# Patient Record
Sex: Female | Born: 1975 | Race: White | Hispanic: Yes | Marital: Married | Smoking: Never smoker
Health system: Southern US, Community
[De-identification: ages and names within clinical notes are randomized; demographics above are authoritative.]

## PROBLEM LIST (undated history)

## (undated) DIAGNOSIS — Z8619 Personal history of other infectious and parasitic diseases: Secondary | ICD-10-CM

## (undated) DIAGNOSIS — M1990 Unspecified osteoarthritis, unspecified site: Secondary | ICD-10-CM

## (undated) HISTORY — DX: Unspecified osteoarthritis, unspecified site: M19.90

## (undated) HISTORY — DX: Personal history of other infectious and parasitic diseases: Z86.19

---

## 2001-12-03 HISTORY — PX: TUBAL LIGATION: SHX77

## 2005-04-27 ENCOUNTER — Ambulatory Visit: Payer: Self-pay | Admitting: Obstetrics and Gynecology

## 2005-06-01 ENCOUNTER — Ambulatory Visit: Payer: Self-pay

## 2005-09-24 ENCOUNTER — Encounter: Admission: RE | Admit: 2005-09-24 | Discharge: 2005-12-23 | Payer: Self-pay | Admitting: Anesthesiology

## 2005-09-24 ENCOUNTER — Ambulatory Visit: Payer: Self-pay | Admitting: Anesthesiology

## 2005-12-13 ENCOUNTER — Ambulatory Visit: Payer: Self-pay | Admitting: Physical Medicine and Rehabilitation

## 2005-12-13 ENCOUNTER — Encounter
Admission: RE | Admit: 2005-12-13 | Discharge: 2006-03-13 | Payer: Self-pay | Admitting: Physical Medicine and Rehabilitation

## 2010-10-03 ENCOUNTER — Ambulatory Visit: Payer: Self-pay | Admitting: Obstetrics and Gynecology

## 2011-12-04 LAB — HM PAP SMEAR: HM Pap smear: NORMAL

## 2013-09-29 LAB — LIPID PANEL: Cholesterol: 196

## 2013-09-30 ENCOUNTER — Ambulatory Visit: Payer: Self-pay | Admitting: Adult Health

## 2015-05-04 ENCOUNTER — Ambulatory Visit: Payer: Self-pay | Admitting: Family Medicine

## 2015-12-19 ENCOUNTER — Ambulatory Visit (INDEPENDENT_AMBULATORY_CARE_PROVIDER_SITE_OTHER): Payer: Commercial Managed Care - PPO | Admitting: Family Medicine

## 2015-12-19 ENCOUNTER — Encounter: Payer: Self-pay | Admitting: Family Medicine

## 2015-12-19 VITALS — BP 110/60 | HR 64 | Temp 98.6°F | Ht 60.5 in | Wt 113.0 lb

## 2015-12-19 DIAGNOSIS — Z0000 Encounter for general adult medical examination without abnormal findings: Secondary | ICD-10-CM | POA: Diagnosis not present

## 2015-12-19 DIAGNOSIS — Z13220 Encounter for screening for lipoid disorders: Secondary | ICD-10-CM | POA: Diagnosis not present

## 2015-12-19 DIAGNOSIS — Z23 Encounter for immunization: Secondary | ICD-10-CM

## 2015-12-19 DIAGNOSIS — Z131 Encounter for screening for diabetes mellitus: Secondary | ICD-10-CM | POA: Diagnosis not present

## 2015-12-19 DIAGNOSIS — M79644 Pain in right finger(s): Secondary | ICD-10-CM | POA: Diagnosis not present

## 2015-12-19 DIAGNOSIS — Z1239 Encounter for other screening for malignant neoplasm of breast: Secondary | ICD-10-CM | POA: Diagnosis not present

## 2015-12-19 LAB — BASIC METABOLIC PANEL
BUN: 11 mg/dL (ref 6–23)
CHLORIDE: 106 meq/L (ref 96–112)
CO2: 26 mEq/L (ref 19–32)
CREATININE: 0.51 mg/dL (ref 0.40–1.20)
Calcium: 9.4 mg/dL (ref 8.4–10.5)
GFR: 141.26 mL/min (ref >60.00)
GLUCOSE: 84 mg/dL (ref 70–99)
Potassium: 4.3 mEq/L (ref 3.5–5.1)
Sodium: 140 mEq/L (ref 135–145)

## 2015-12-19 LAB — LIPID PANEL
CHOL/HDL RATIO: 3
CHOLESTEROL: 200 mg/dL (ref 0–200)
HDL: 58.9 mg/dL (ref >39.00)
LDL CALC: 129 mg/dL — AB (ref 0–99)
NonHDL: 141.43
TRIGLYCERIDES: 60 mg/dL (ref 0.0–149.0)
VLDL: 12 mg/dL (ref 0.0–40.0)

## 2015-12-19 NOTE — Patient Instructions (Addendum)
Gusto conocerla hoy. La llamaremos para cita para mamograma en [facility]. labwork today. Return as needed or in 1 year for next physical.  Health Maintenance, Female Adopting a healthy lifestyle and getting preventive care can go a long way to promote health and wellness. Talk with your health care provider about what schedule of regular examinations is right for you. This is a good chance for you to check in with your provider about disease prevention and staying healthy. In between checkups, there are plenty of things you can do on your own. Experts have done a lot of research about which lifestyle changes and preventive measures are most likely to keep you healthy. Ask your health care provider for more information. WEIGHT AND DIET  Eat a healthy diet  Be sure to include plenty of vegetables, fruits, low-fat dairy products, and lean protein.  Do not eat a lot of foods high in solid fats, added sugars, or salt.  Get regular exercise. This is one of the most important things you can do for your health.  Most adults should exercise for at least 150 minutes each week. The exercise should increase your heart rate and make you sweat (moderate-intensity exercise).  Most adults should also do strengthening exercises at least twice a week. This is in addition to the moderate-intensity exercise.  Maintain a healthy weight  Body mass index (BMI) is a measurement that can be used to identify possible weight problems. It estimates body fat based on height and weight. Your health care provider can help determine your BMI and help you achieve or maintain a healthy weight.  For females 16 years of age and older:   A BMI below 18.5 is considered underweight.  A BMI of 18.5 to 24.9 is normal.  A BMI of 25 to 29.9 is considered overweight.  A BMI of 30 and above is considered obese.  Watch levels of cholesterol and blood lipids  You should start having your blood tested for lipids  and cholesterol at 41 years of age, then have this test every 5 years.  You may need to have your cholesterol levels checked more often if:  Your lipid or cholesterol levels are high.  You are older than 41 years of age.  You are at high risk for heart disease.  CANCER SCREENING   Lung Cancer  Lung cancer screening is recommended for adults 74-76 years old who are at high risk for lung cancer because of a history of smoking.  A yearly low-dose CT scan of the lungs is recommended for people who:  Currently smoke.  Have quit within the past 15 years.  Have at least a 30-pack-year history of smoking. A pack year is smoking an average of one pack of cigarettes a day for 1 year.  Yearly screening should continue until it has been 15 years since you quit.  Yearly screening should stop if you develop a health problem that would prevent you from having lung cancer treatment.  Breast Cancer  Practice breast self-awareness. This means understanding how your breasts normally appear and feel.  It also means doing regular breast self-exams. Let your health care provider know about any changes, no matter how small.  If you are in your 20s or 30s, you should have a clinical breast exam (CBE) by a health care provider every 1-3 years as part of a regular health exam.  If you are 31 or older, have a CBE every year. Also consider having a breast  X-ray (mammogram) every year.  If you have a family history of breast cancer, talk to your health care provider about genetic screening.  If you are at high risk for breast cancer, talk to your health care provider about having an MRI and a mammogram every year.  Breast cancer gene (BRCA) assessment is recommended for women who have family members with BRCA-related cancers. BRCA-related cancers include:  Breast.  Ovarian.  Tubal.  Peritoneal cancers.  Results of the assessment will determine the need for genetic counseling and BRCA1 and  BRCA2 testing. Cervical Cancer Your health care provider may recommend that you be screened regularly for cancer of the pelvic organs (ovaries, uterus, and vagina). This screening involves a pelvic examination, including checking for microscopic changes to the surface of your cervix (Pap test). You may be encouraged to have this screening done every 3 years, beginning at age 1.  For women ages 55-65, health care providers may recommend pelvic exams and Pap testing every 3 years, or they may recommend the Pap and pelvic exam, combined with testing for human papilloma virus (HPV), every 5 years. Some types of HPV increase your risk of cervical cancer. Testing for HPV may also be done on women of any age with unclear Pap test results.  Other health care providers may not recommend any screening for nonpregnant women who are considered low risk for pelvic cancer and who do not have symptoms. Ask your health care provider if a screening pelvic exam is right for you.  If you have had past treatment for cervical cancer or a condition that could lead to cancer, you need Pap tests and screening for cancer for at least 20 years after your treatment. If Pap tests have been discontinued, your risk factors (such as having a new sexual partner) need to be reassessed to determine if screening should resume. Some women have medical problems that increase the chance of getting cervical cancer. In these cases, your health care provider may recommend more frequent screening and Pap tests. Colorectal Cancer  This type of cancer can be detected and often prevented.  Routine colorectal cancer screening usually begins at 41 years of age and continues through 41 years of age.  Your health care provider may recommend screening at an earlier age if you have risk factors for colon cancer.  Your health care provider may also recommend using home test kits to check for hidden blood in the stool.  A small camera at the end of  a tube can be used to examine your colon directly (sigmoidoscopy or colonoscopy). This is done to check for the earliest forms of colorectal cancer.  Routine screening usually begins at age 58.  Direct examination of the colon should be repeated every 5-10 years through 41 years of age. However, you may need to be screened more often if early forms of precancerous polyps or small growths are found. Skin Cancer  Check your skin from head to toe regularly.  Tell your health care provider about any new moles or changes in moles, especially if there is a change in a mole's shape or color.  Also tell your health care provider if you have a mole that is larger than the size of a pencil eraser.  Always use sunscreen. Apply sunscreen liberally and repeatedly throughout the day.  Protect yourself by wearing long sleeves, pants, a wide-brimmed hat, and sunglasses whenever you are outside. HEART DISEASE, DIABETES, AND HIGH BLOOD PRESSURE   High blood pressure causes heart  disease and increases the risk of stroke. High blood pressure is more likely to develop in:  People who have blood pressure in the high end of the normal range (130-139/85-89 mm Hg).  People who are overweight or obese.  People who are African American.  If you are 71-41 years of age, have your blood pressure checked every 3-5 years. If you are 58 years of age or older, have your blood pressure checked every year. You should have your blood pressure measured twice--once when you are at a hospital or clinic, and once when you are not at a hospital or clinic. Record the average of the two measurements. To check your blood pressure when you are not at a hospital or clinic, you can use:  An automated blood pressure machine at a pharmacy.  A home blood pressure monitor.  If you are between 61 years and 78 years old, ask your health care provider if you should take aspirin to prevent strokes.  Have regular diabetes screenings. This  involves taking a blood sample to check your fasting blood sugar level.  If you are at a normal weight and have a low risk for diabetes, have this test once every three years after 41 years of age.  If you are overweight and have a high risk for diabetes, consider being tested at a younger age or more often. PREVENTING INFECTION  Hepatitis B  If you have a higher risk for hepatitis B, you should be screened for this virus. You are considered at high risk for hepatitis B if:  You were born in a country where hepatitis B is common. Ask your health care provider which countries are considered high risk.  Your parents were born in a high-risk country, and you have not been immunized against hepatitis B (hepatitis B vaccine).  You have HIV or AIDS.  You use needles to inject street drugs.  You live with someone who has hepatitis B.  You have had sex with someone who has hepatitis B.  You get hemodialysis treatment.  You take certain medicines for conditions, including cancer, organ transplantation, and autoimmune conditions. Hepatitis C  Blood testing is recommended for:  Everyone born from [birth year] through 1966.  Anyone with known risk factors for hepatitis C. Sexually transmitted infections (STIs)  You should be screened for sexually transmitted infections (STIs) including gonorrhea and chlamydia if:  You are sexually active and are younger than 41 years of age.  You are older than 41 years of age and your health care provider tells you that you are at risk for this type of infection.  Your sexual activity has changed since you were last screened and you are at an increased risk for chlamydia or gonorrhea. Ask your health care provider if you are at risk.  If you do not have HIV, but are at risk, it may be recommended that you take a prescription medicine daily to prevent HIV infection. This is called pre-exposure prophylaxis (PrEP). You are considered at risk if:  You are  sexually active and do not regularly use condoms or know the HIV status of your partner(s).  You take drugs by injection.  You are sexually active with a partner who has HIV. Talk with your health care provider about whether you are at high risk of being infected with HIV. If you choose to begin PrEP, you should first be tested for HIV. You should then be tested every 3 months for as long as you are taking  PrEP.  PREGNANCY   If you are premenopausal and you may become pregnant, ask your health care provider about preconception counseling.  If you may become pregnant, take 400 to 800 micrograms (mcg) of folic acid every day.  If you want to prevent pregnancy, talk to your health care provider about birth control (contraception). OSTEOPOROSIS AND MENOPAUSE   Osteoporosis is a disease in which the bones lose minerals and strength with aging. This can result in serious bone fractures. Your risk for osteoporosis can be identified using a bone density scan.  If you are 65 years of age or older, or if you are at risk for osteoporosis and fractures, ask your health care provider if you should be screened.  Ask your health care provider whether you should take a calcium or vitamin D supplement to lower your risk for osteoporosis.  Menopause may have certain physical symptoms and risks.  Hormone replacement therapy may reduce some of these symptoms and risks. Talk to your health care provider about whether hormone replacement therapy is right for you.  HOME CARE INSTRUCTIONS   Schedule regular health, dental, and eye exams.  Stay current with your immunizations.   Do not use any tobacco products including cigarettes, chewing tobacco, or electronic cigarettes.  If you are pregnant, do not drink alcohol.  If you are breastfeeding, limit how much and how often you drink alcohol.  Limit alcohol intake to no more than 1 drink per day for nonpregnant women. One drink equals 12 ounces of beer, 5  ounces of wine, or 1 ounces of hard liquor.  Do not use street drugs.  Do not share needles.  Ask your health care provider for help if you need support or information about quitting drugs.  Tell your health care provider if you often feel depressed.  Tell your health care provider if you have ever been abused or do not feel safe at home.   This information is not intended to replace advice given to you by your health care provider. Make sure you discuss any questions you have with your health care provider.   Document Released: 06/04/2011 Document Revised: 12/10/2014 Document Reviewed: 10/21/2013 Elsevier Interactive Patient Education [employer].

## 2015-12-19 NOTE — Addendum Note (Signed)
Addended by: Royann Shivers A on: 12/19/2015 10:54 AM   Modules accepted: Orders

## 2015-12-19 NOTE — Assessment & Plan Note (Addendum)
Preventative protocols reviewed and updated unless pt declined. Discussed healthy diet and lifestyle.  Well woman exam will be due next year.

## 2015-12-19 NOTE — Assessment & Plan Note (Signed)
At [facility]. Has seen rheum, s/p MRI. Will await records she will bring for me to review. Advised return when in acute flare for further eval.

## 2015-12-19 NOTE — Progress Notes (Signed)
Pre visit review using our clinic review tool, if applicable. No additional management support is needed unless otherwise documented below in the visit note. 

## 2015-12-19 NOTE — Progress Notes (Signed)
BP 110/60 mmHg  Pulse 64  Temp(Src) 98.6 F (37 C) (Oral)  Ht 5' 0.5" (1.537 m)  Wt 113 lb (51.256 kg)  BMI 21.70 kg/m2  LMP 11/14/2015   CC: new pt to establish, requests CPE Subjective:    Patient ID: Olivia Estrada, female    DOB: 09-May-1976, 40 y.o.   MRN: [id number]  HPI: Olivia Estrada is a 40 y.o. female presenting on 12/19/2015 for Establish Care   Arthritis - started in R thumb at [facility]. Rest helps. Doesn't try medicine for this. About once a year has flare where thumb joint gets red, swollen very tender. Prednisone helps. Has seen hand specialist with [facility] who told her she had beginnings of arthritis. 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 Also notices knee pain intermittently.   Preventative: Last CPE 1 yr ago Well woman - 2 yrs ago with Dr Patty Sermons at [facility]. Normal pap smear. All normal in the past. Pap smear will be due next year.  Mammogram -  First mammogram normal at age 12yo. Interested in rpt today. Does breast exams at home.  Flu - declines Tdap - today Seat belt use discussed Sunscreen use discussed. No changing moles on skin.  LMP 11/14/2015. 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 Interim medical history since our last visit reviewed. Allergies and medications reviewed and updated. No current outpatient prescriptions on file prior to visit.   No current facility-administered medications on file prior to visit.    Review of Systems  Constitutional: Negative for fever, chills, activity change, appetite change, fatigue and unexpected weight change.  HENT: Negative for hearing loss.   Eyes: Negative for visual disturbance.  Respiratory: Negative for cough, chest  tightness, shortness of breath and wheezing.   Cardiovascular: Negative for chest pain, palpitations and leg swelling.  Gastrointestinal: Positive for constipation. Negative for nausea, vomiting, abdominal pain, diarrhea, blood in stool and abdominal distention.  Genitourinary: Negative for hematuria and difficulty urinating.  Musculoskeletal: Negative for myalgias, arthralgias and neck pain.  Skin: Negative for rash.  Neurological: Negative for dizziness, seizures, syncope and headaches.  Hematological: Negative for adenopathy. Does not bruise/bleed easily.  Psychiatric/Behavioral: Negative for dysphoric mood. The patient is not nervous/anxious.    Per HPI unless specifically indicated in ROS section     Objective:    BP 110/60 mmHg  Pulse 64  Temp(Src) 98.6 F (37 C) (Oral)  Ht 5' 0.5" (1.537 m)  Wt 113 lb (51.256 kg)  BMI 21.70 kg/m2  LMP 11/14/2015  Wt Readings from Last 3 Encounters:  12/19/15 113 lb (51.256 kg)    Physical Exam  Constitutional: She is oriented to person, place, and time. She appears well-developed and well-nourished. No distress.  HENT:  Head: Normocephalic and atraumatic.  Right Ear: Hearing, tympanic membrane, external ear and ear canal normal.  Left Ear: Hearing, tympanic membrane, external ear and ear canal normal.  Nose: Nose normal.  Mouth/Throat: Uvula is midline, oropharynx is clear and moist and mucous membranes are normal. No oropharyngeal exudate, posterior oropharyngeal edema or posterior oropharyngeal erythema.  Eyes: Conjunctivae and EOM are normal. Pupils are equal, round, and reactive to light. No scleral icterus.  Neck: Normal range of motion. Neck supple. No thyromegaly  present.  Cardiovascular: Normal rate, regular rhythm, normal heart sounds and intact distal pulses.   No murmur heard. Pulses:      Radial pulses are 2+ on the right side, and 2+ on the left side.  Pulmonary/Chest: Effort normal and breath sounds normal. No respiratory  distress. She has no wheezes. She has no rales.  Abdominal: Soft. Bowel sounds are normal. She exhibits no distension and no mass. There is no tenderness. There is no rebound and no guarding.  Musculoskeletal: Normal range of motion. She exhibits no edema.  Lymphadenopathy:    She has no cervical adenopathy.  Neurological: She is alert and oriented to person, place, and time.  CN grossly intact, station and gait intact  Skin: Skin is warm and dry. No rash noted.  Psychiatric: She has a normal mood and affect. Her behavior is normal. Judgment and thought content normal.  Nursing note and vitals reviewed.  No results found for this or any previous visit.    Assessment & Plan:   Problem List Items Addressed This Visit    Pain of right thumb    At [facility]. Has seen rheum, s/p MRI. Will await records she will bring for me to review. Advised return when in acute flare for further eval.      Health maintenance examination - Primary    Preventative protocols reviewed and updated unless pt declined. Discussed healthy diet and lifestyle.  Well woman exam will be due next year.       Other Visit Diagnoses    Breast cancer screening        Relevant Orders    MM Digital Screening    Lipid screening        Relevant Orders    Lipid panel    Diabetes mellitus screening        Relevant Orders    Basic metabolic panel        Follow up plan: Return in about 1 year (around 12/18/2016), or as needed, for annual exam, prior fasting for blood work.

## 2015-12-20 ENCOUNTER — Telehealth: Payer: Self-pay | Admitting: Family Medicine

## 2015-12-20 ENCOUNTER — Encounter: Payer: Self-pay | Admitting: *Deleted

## 2015-12-20 NOTE — Telephone Encounter (Signed)
Noted. Will cancel referral, rediscuss at next CPE.

## 2015-12-20 NOTE — Telephone Encounter (Signed)
Patient was seen yesterday.  Patient said she has decided she doesn't want to be referred for a  mammogram.

## 2015-12-24 ENCOUNTER — Encounter: Payer: Self-pay | Admitting: Family Medicine

## 2015-12-28 ENCOUNTER — Encounter: Payer: Self-pay | Admitting: *Deleted

## 2016-07-17 ENCOUNTER — Encounter: Payer: Self-pay | Admitting: Family Medicine

## 2016-07-17 ENCOUNTER — Ambulatory Visit (INDEPENDENT_AMBULATORY_CARE_PROVIDER_SITE_OTHER): Payer: Commercial Managed Care - PPO | Admitting: Family Medicine

## 2016-07-17 VITALS — BP 106/68 | Temp 97.9°F | Wt 105.8 lb

## 2016-07-17 DIAGNOSIS — M79644 Pain in right finger(s): Secondary | ICD-10-CM | POA: Diagnosis not present

## 2016-07-17 LAB — URIC ACID: Uric Acid, Serum: 2.5 mg/dL (ref 2.4–7.0)

## 2016-07-17 MED ORDER — PREDNISONE 20 MG PO TABS: ORAL_TABLET | ORAL | 0 refills | Status: DC

## 2016-07-17 NOTE — Progress Notes (Signed)
   Subjective:    Patient ID: Olivia Estrada, female    DOB: Apr 25, 1976, 40 y.o.   MRN: [id number]  HPI  40 year old female pt of Dr. Darnell Level (new to practice in 12/2105) presents for  chronic right thumb pain with acute worsening of pain in last 24 hours. She had sudden onset pain 4 Am, she feels may have flared when she was sleeping on it wrong. She has noted increase in swelling , no redness, no warmth in joint.  She has not taken anything for pain.   No new fall or injury.  She  Has had [facility] pain on right for years. Once a year she has a flare and in past prednisone has helped. Has seen hand specialist with [facility] who told her she had beginnings of arthritis.  MRI thumb per pt 2012 showed arthritis.  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  She is not sure she was tested for gout.   She would like referral to different hand specialist.  Body mass index is 20.31 kg/m.  Review of Systems  Constitutional: Negative for fatigue.  HENT: Negative for ear pain.   Eyes: Negative for pain.  Respiratory: Negative for cough and shortness of breath.        Objective:   Physical Exam  Constitutional: Vital signs are normal. She appears well-developed and well-nourished. She is cooperative.  Non-toxic appearance. She does not appear ill. No distress.  HENT:  Head: Normocephalic.  Right Ear: Hearing, tympanic membrane, external ear and ear canal normal. Tympanic membrane is not erythematous, not retracted and not bulging.  Left Ear: Hearing, tympanic membrane, external ear and ear canal normal. Tympanic membrane is not erythematous, not retracted and not bulging.  Nose: No mucosal edema or rhinorrhea. Right sinus exhibits no maxillary sinus tenderness and no frontal sinus tenderness. Left sinus exhibits no maxillary sinus tenderness and no frontal sinus tenderness.  Mouth/Throat: Uvula is midline, oropharynx is clear and moist and mucous membranes are normal.  Eyes:  Conjunctivae, EOM and lids are normal. Pupils are equal, round, and reactive to light. Lids are everted and swept, no foreign bodies found.  Neck: Trachea normal and normal range of motion. Neck supple. Carotid bruit is not present. No thyroid mass and no thyromegaly present.  Cardiovascular: Normal rate, regular rhythm, S1 normal, S2 normal, normal heart sounds, intact distal pulses and normal pulses.  Exam reveals no gallop and no friction rub.   No murmur heard. Pulmonary/Chest: Effort normal and breath sounds normal. No tachypnea. No respiratory distress. She has no decreased breath sounds. She has no wheezes. She has no rhonchi. She has no rales.  Abdominal: Soft. Normal appearance and bowel sounds are normal. There is no tenderness.  Musculoskeletal:  Right thumb MCP joint with swelling, no redness  decreased ROM, very tender to touch in right MCP  Neurological: She is alert.  Skin: Skin is warm, dry and intact. No rash noted.  Psychiatric: Her speech is normal and behavior is normal. Judgment and thought content normal. Her mood appears not anxious. Cognition and memory are normal. She does not exhibit a depressed mood.          Assessment & Plan:

## 2016-07-17 NOTE — Patient Instructions (Addendum)
Complete a course of prednisone.  Stop at lab on way out. Call if pain is not improving. Stop at front desk to set up referral.

## 2016-07-17 NOTE — Assessment & Plan Note (Signed)
Likely [facility] joint arthritis. Will check uric acid flor gout given location. Severe pain with flare.  Treat with course of prednisone.. Refer to hand specialist as pt would like definitive treatment for OA recommendations such as surgery if possible.

## 2016-12-11 ENCOUNTER — Other Ambulatory Visit: Payer: Self-pay | Admitting: Family Medicine

## 2016-12-11 ENCOUNTER — Encounter: Payer: Self-pay | Admitting: Family Medicine

## 2016-12-11 DIAGNOSIS — Z131 Encounter for screening for diabetes mellitus: Secondary | ICD-10-CM

## 2016-12-12 ENCOUNTER — Other Ambulatory Visit: Payer: Commercial Managed Care - PPO

## 2016-12-19 ENCOUNTER — Encounter: Payer: Commercial Managed Care - PPO | Admitting: Family Medicine

## 2016-12-25 ENCOUNTER — Other Ambulatory Visit (INDEPENDENT_AMBULATORY_CARE_PROVIDER_SITE_OTHER): Payer: Commercial Managed Care - PPO

## 2016-12-25 DIAGNOSIS — Z131 Encounter for screening for diabetes mellitus: Secondary | ICD-10-CM | POA: Diagnosis not present

## 2016-12-25 LAB — BASIC METABOLIC PANEL
BUN: 10 mg/dL (ref 6–23)
CO2: 27 meq/L (ref 19–32)
Calcium: 9.3 mg/dL (ref 8.4–10.5)
Chloride: 106 mEq/L (ref 96–112)
Creatinine, Ser: 0.53 mg/dL (ref 0.40–1.20)
GFR: 134.45 mL/min (ref >60.00)
Glucose, Bld: 96 mg/dL (ref 70–99)
Potassium: 4 mEq/L (ref 3.5–5.1)
SODIUM: 138 meq/L (ref 135–145)

## 2016-12-31 ENCOUNTER — Encounter: Payer: Commercial Managed Care - PPO | Admitting: Family Medicine

## 2017-01-01 ENCOUNTER — Ambulatory Visit (INDEPENDENT_AMBULATORY_CARE_PROVIDER_SITE_OTHER): Payer: Commercial Managed Care - PPO | Admitting: Family Medicine

## 2017-01-01 ENCOUNTER — Other Ambulatory Visit (HOSPITAL_COMMUNITY)
Admission: RE | Admit: 2017-01-01 | Discharge: 2017-01-01 | Disposition: A | Discharge status: Home / Self Care | Payer: Commercial Managed Care - PPO | Source: Ambulatory Visit | Attending: Family Medicine | Admitting: Family Medicine

## 2017-01-01 ENCOUNTER — Encounter: Payer: Self-pay | Admitting: Family Medicine

## 2017-01-01 VITALS — BP 108/60 | HR 68 | Temp 98.2°F | Ht 60.5 in | Wt 111.0 lb

## 2017-01-01 DIAGNOSIS — Z1151 Encounter for screening for human papillomavirus (HPV): Secondary | ICD-10-CM | POA: Insufficient documentation

## 2017-01-01 DIAGNOSIS — Z124 Encounter for screening for malignant neoplasm of cervix: Secondary | ICD-10-CM | POA: Insufficient documentation

## 2017-01-01 DIAGNOSIS — R87810 Cervical high risk human papillomavirus (HPV) DNA test positive: Secondary | ICD-10-CM | POA: Diagnosis present

## 2017-01-01 DIAGNOSIS — M79644 Pain in right finger(s): Secondary | ICD-10-CM | POA: Diagnosis not present

## 2017-01-01 DIAGNOSIS — Z0000 Encounter for general adult medical examination without abnormal findings: Secondary | ICD-10-CM | POA: Diagnosis not present

## 2017-01-01 DIAGNOSIS — Z01419 Encounter for gynecological examination (general) (routine) without abnormal findings: Secondary | ICD-10-CM | POA: Diagnosis present

## 2017-01-01 DIAGNOSIS — N907 Vulvar cyst: Secondary | ICD-10-CM | POA: Insufficient documentation

## 2017-01-01 NOTE — Progress Notes (Signed)
Pre visit review using our clinic review tool, if applicable. No additional management support is needed unless otherwise documented below in the visit note. 

## 2017-01-01 NOTE — Progress Notes (Signed)
BP 108/60   Pulse 68   Temp 98.2 F (36.8 C) (Oral)   Ht 5' 0.5" (1.537 m)   Wt 111 lb (50.3 kg)   LMP 12/27/2016   BMI 21.32 kg/m    CC: CPE Subjective:    Patient ID: Olivia Estrada, female    DOB: [birth year], 41 y.o.   MRN: [id number]  HPI: CHACE FLEER is a 41 y.o. female presenting on 01/01/2017 for Annual Exam   Finger pain - only affecting R thumb at MCP. See prior notes for details. About once a year has flare where thumb joint gets red, swollen very tender. Only prednisone is helpful, needs about once a year. Has seen hand specialist with MRI who told her she had beginnings of arthritis. Last urate was normal (2.5). Brings MRI from 2012 showing likely ganglion cyst at wrist. Latest flare was 07/2016.   Preventative: Well woman - 3 yrs ago with Dr Patty Sermons PCP at [facility]. Normal pap smear. All normal in the past. Pap smear due this year.  Mammogram -  Mammogram normal at age 53yo. Does breast exams at home.  Flu - declines Tdap - 2017 Seat belt use discussed Sunscreen use discussed. No changing moles on skin.  LMP 12/27/2016. Regular periods.  Non smoker Alcohol - socially  Lives with husband and son, 1 dog and parrot Occupation: Materials engineer and house cleaning, works 4d/wk at [employer]: walks 1 hour every day Diet: 8 glasses of water daily, fruits/vegteables daily  Relevant past medical, surgical, family and social history reviewed and updated as indicated. Interim medical history since our last visit reviewed. Allergies and medications reviewed and updated. Current Outpatient Prescriptions on File Prior to Visit  Medication Sig  . Multiple Vitamin (MULTIVITAMIN) tablet Take 1 tablet by mouth daily.   No current facility-administered medications on file prior to visit.     Review of Systems  Constitutional: Negative for activity change, appetite change, chills, fatigue, fever and unexpected weight change.  HENT: Negative for hearing loss.     Eyes: Negative for visual disturbance.  Respiratory: Negative for cough, chest tightness, shortness of breath and wheezing.   Cardiovascular: Negative for chest pain, palpitations and leg swelling.  Gastrointestinal: Positive for blood in stool (occasionally with hemorrhoids). Negative for abdominal distention, abdominal pain, constipation, diarrhea, nausea and vomiting.  Genitourinary: Negative for difficulty urinating and hematuria.  Musculoskeletal: Negative for arthralgias, myalgias and neck pain.  Skin: Negative for rash.  Neurological: Negative for dizziness, seizures, syncope and headaches.  Hematological: Negative for adenopathy. Does not bruise/bleed easily.  Psychiatric/Behavioral: Negative for dysphoric mood. The patient is not nervous/anxious.    Per HPI unless specifically indicated in ROS section     Objective:    BP 108/60   Pulse 68   Temp 98.2 F (36.8 C) (Oral)   Ht 5' 0.5" (1.537 m)   Wt 111 lb (50.3 kg)   LMP 12/27/2016   BMI 21.32 kg/m   Wt Readings from Last 3 Encounters:  01/01/17 111 lb (50.3 kg)  07/17/16 105 lb 12 oz (48 kg)  12/19/15 113 lb (51.3 kg)    Physical Exam  Constitutional: She is oriented to person, place, and time. She appears well-developed and well-nourished. No distress.  HENT:  Head: Normocephalic and atraumatic.  Right Ear: Hearing, tympanic membrane, external ear and ear canal normal.  Left Ear: Hearing, tympanic membrane, external ear and ear canal normal.  Nose: Nose normal.  Mouth/Throat: Uvula is midline,  oropharynx is clear and moist and mucous membranes are normal. No oropharyngeal exudate, posterior oropharyngeal edema or posterior oropharyngeal erythema.  Eyes: Conjunctivae and EOM are normal. Pupils are equal, round, and reactive to light. No scleral icterus.  Neck: Normal range of motion. Neck supple. No thyromegaly present.  Cardiovascular: Normal rate, regular rhythm, normal heart sounds and intact distal pulses.   No  murmur heard. Pulses:      Radial pulses are 2+ on the right side, and 2+ on the left side.  Pulmonary/Chest: Effort normal and breath sounds normal. No respiratory distress. She has no wheezes. She has no rales. Right breast exhibits no inverted nipple, no mass, no nipple discharge, no skin change and no tenderness. Left breast exhibits no inverted nipple, no mass, no nipple discharge, no skin change and no tenderness.  Abdominal: Soft. Bowel sounds are normal. She exhibits no distension and no mass. There is no tenderness. There is no rebound and no guarding.  Genitourinary: Vagina normal and uterus normal. Pelvic exam was performed with patient supine. There is lesion (small cyst) on the right labia. There is no rash or tenderness on the right labia. There is no rash, tenderness or lesion on the left labia. Cervix exhibits no motion tenderness and no friability. Right adnexum displays no mass and no tenderness. Left adnexum displays no mass and no tenderness.  Genitourinary Comments: Pap performed  Musculoskeletal: Normal range of motion. She exhibits no edema.  Lymphadenopathy:       Head (right side): No submental, no submandibular, no tonsillar, no preauricular and no posterior auricular adenopathy present.       Head (left side): No submental, no submandibular, no tonsillar, no preauricular and no posterior auricular adenopathy present.    She has no cervical adenopathy.    She has no axillary adenopathy.       Right axillary: No lateral adenopathy present.       Left axillary: No lateral adenopathy present.      Right: No supraclavicular adenopathy present.       Left: No supraclavicular adenopathy present.  Neurological: She is alert and oriented to person, place, and time.  CN grossly intact, station and gait intact  Skin: Skin is warm and dry. No rash noted.  Psychiatric: She has a normal mood and affect. Her behavior is normal. Judgment and thought content normal.  Nursing note and  vitals reviewed.  Results for orders placed or performed in visit on [id number]  Basic metabolic panel  Result Value Ref Range   Sodium 138 135 - 145 mEq/L   Potassium 4.0 3.5 - 5.1 mEq/L   Chloride 106 96 - 112 mEq/L   CO2 27 19 - 32 mEq/L   Glucose, Bld 96 70 - 99 mg/dL   BUN 10 6 - 23 mg/dL   Creatinine, Ser 0.53 0.40 - 1.20 mg/dL   Calcium 9.3 8.4 - 10.5 mg/dL   GFR 134.45 >60.00 mL/min      Assessment & Plan:   Problem List Items Addressed This Visit    Health maintenance examination - Primary    Preventative protocols reviewed and updated unless pt declined. Discussed healthy diet and lifestyle.       Pain of right thumb    1st MCP pain that flares once yearly, only improved with prednisone course.  Urate not consistent with gout. Will refer to hand surgery for further eval/treatment options ?surgical intervention.       Relevant Orders   Ambulatory referral to  Hand Surgery   Vulvar cyst    Discussed with patient - will monitor for now.           Follow up plan: Return in about 1 year (around 01/01/2018) for annual exam, prior fasting for blood work.  Ria Bush, MD

## 2017-01-01 NOTE — Assessment & Plan Note (Signed)
Preventative protocols reviewed and updated unless pt declined. Discussed healthy diet and lifestyle.  

## 2017-01-01 NOTE — Assessment & Plan Note (Signed)
1st MCP pain that flares once yearly, only improved with prednisone course.  Urate not consistent with gout. Will refer to hand surgery for further eval/treatment options ?surgical intervention.

## 2017-01-01 NOTE — Patient Instructions (Addendum)
La mandaremos a otro especialista de mano para evaluar artritis del dedo.  Gusto verla hoy, llamenos con preguntas.  Papanicolau hoy.  Regresar en 1 ao para proximo examen fisico  Mantenimiento de la salud - Mujeres (Health Maintenance, Female) Un estilo de vida saludable y los cuidados preventivos pueden favorecer considerablemente a la salud y Musician. Pregunte a su mdico cul es el cronograma de exmenes peridicos apropiado para usted. Esta es una buena oportunidad para consultarlo sobre cmo prevenir enfermedades y [place] sano. Adems de los controles, hay muchas otras cosas que puede hacer usted mismo. Los expertos han realizado numerosas investigaciones [employer]vin[employer]itor cambios en el estilo de vida y las medidas de prevencin que, [place], lo ayudarn a mantenerse sano. Solicite a su mdico ms informacin. EL PESO Y LA DIETA Consuma una dieta saludable.   Asegrese de [employer] Stores verduras, frutas, productos lcteos de bajo contenido de [place] y Advertising account planner.  No consuma muchos alimentos de alto contenido de grasas slidas, azcares agregados o sal.  Realice actividad fsica con regularidad. Esta es una de las prcticas ms importantes que puede hacer por su salud.  La mayora de los adultos deben hacer ejercicio durante al menos 136mnutos por semana. El ejercicio debe aumentar la frecuencia cardaca y pActorla transpiracin (ejercicio de iPaul.  La mayora de los adultos tambin deben hField seismologistejercicios de elongacin al mToysRusveces a la semana. Agregue esto al su plan de ejercicio de intensidad moderada. Mantenga un peso saludable.   El ndice de masa corporal ([facility] es una medida que puede utilizarse para identificar posibles problemas de p[place] Proporciona una estimacin de la grasa corporal basndose en el peso y la altura. Su mdico puede ayudarle a dRadiation protection practitionerISt. Stepheny a lScientist, forensico mTheatre managerun peso saludable.  Para las mujeres de 20aos o  ms:  Un IRecovery Innovations, Inc.menor de 18,5 se considera bajo peso.  Un IMcleod Medical Center-Darlingtonentre 18,5 y 24,9 es normal.  Un ICares Surgicenter LLCentre 25 y 29,9 se considera sobrepeso.  Un IMC de 30 o ms se considera obesidad. Observe los niveles de colesterol y lpidos en la sangre.   Debe comenzar a rEnglish as a second language teacherde lpidos y cResearch officer, trade unionen la sangre a los 20aos y luego repetirlos cada 599aos  Es posible que nAutomotive engineerlos niveles de colesterol con mayor frecuencia si:  Sus niveles de lpidos y colesterol son altos.  Es mayor de 50aos.  Presenta un alto riesgo de padecer enfermedades cardacas. DETECCIN DE CNCER Cncer de pulmn   Se recomienda realizar exmenes de deteccin de cncer de pulmn a personas adultas entre 540y 843aos que estn en riesgo de dHorticulturist, commercialde pulmn por sus antecedentes de consumo de tabaco.  Se recomienda una tomografa computarizada de baja dosis de los pLiberty Mediaaos a las personas que:  Fuman actualmente.  Hayan dejado el hbito en algn momento en los ltimos 15aos.  Hayan fumado durante 30aos un paquete diario. Un paquete-ao equivale a fumar un promedio de un paquete de cigarrillos diario durante un ao.  Los exmenes de deteccin anuales deben continuar hasta que hayan pasado 15aos desde que dej de fumar.  Ya no debern realizarse si tiene un problema de salud que le impida recibir tratamiento para eScience writerde pulmn. Cncer de mama   Practique la autoconciencia de la mama. Esto significa reconocer la apariencia normal de sus mamas y cmo las siente.  Tambin significa realizar autoexmenes regulares de lJohnson & Johnson Informe a su  mdico sobre cualquier cambio, sin importar cun pequeo sea.  Si tiene entre 20 y 14 aos, un mdico debe realizarle un examen clnico de las mamas como parte del examen regular de [place], cada 1 a 3aos.  Si tiene 40aos o ms, debe Information systems manager clnico de las Microsoft. Tambin considere  realizarse una [place]) todos los [place].  Si tiene antecedentes familiares de cncer de mama, hable con su mdico para someterse a un estudio gentico.  Si tiene alto riesgo de Chief Financial Officer de mama, hable con su mdico para someterse a Public house manager y [employer].  La evaluacin del gen del cncer de mama (BRCA) se recomienda a mujeres que tengan familiares con cnceres relacionados con el BRCA. Los cnceres relacionados con el BRCA incluyen los siguientes:  Mama.  Ovario.  Trompas.  Cnceres de peritoneo.  Los resultados de la evaluacin determinarn la necesidad de asesoramiento gentico y de [place] de BRCA1 y BRCA2. Cncer de cuello del tero  El mdico puede recomendarle que se haga pruebas peridicas de deteccin de cncer de los rganos de la pelvis (ovarios, tero y vagina). Estas pruebas incluyen un examen plvico, que abarca controlar si se produjeron cambios microscpicos en la superficie del cuello del tero (prueba de Papanicolaou). Pueden recomendarle que se haga estas pruebas cada 3aos, a partir de los 21aos.  A las mujeres que tienen entre 30 y 52aos, los mdicos pueden recomendarles que se sometan a exmenes plvicos y pruebas de Papanicolaou cada 38aos, o a la prueba de Papanicolaou y el examen plvico en combinacin con estudios de deteccin del virus del papiloma humano (VPH) cada 5aos. Algunos tipos de VPH aumentan el riesgo de Chief Financial Officer de cuello del tero. La prueba para la deteccin del VPH tambin puede realizarse a mujeres de cualquier edad cuyos resultados de la prueba de Papanicolaou no sean claros.  Es posible que otros mdicos no recomienden exmenes de deteccin a mujeres no embarazadas que se consideran sujetos de bajo riesgo de Chief Financial Officer de pelvis y que no tienen sntomas. Pregntele al mdico si un examen plvico de deteccin es adecuado para usted.  Si ha recibido un tratamiento  para Science writer cervical o una enfermedad que podra causar cncer, necesitar realizarse una prueba de Papanicolaou y controles durante al menos 45 aos de concluido el [place]. Si no se ha hecho el Papanicolaou con regularidad, debern volver a evaluarse los factores de riesgo (como tener un nuevo compaero sexual), para Teacher, adult education si debe realizarse los estudios nuevamente. Algunas mujeres sufren problemas mdicos que aumentan la probabilidad de Museum/gallery curator cncer de cuello del tero. En estos casos, el mdico podr QUALCOMM se realicen controles y pruebas de Papanicolaou con ms frecuencia. Cncer colorrectal   Este tipo de cncer puede detectarse y a menudo prevenirse.  Por lo general, los estudios de rutina se deben Medical laboratory scientific officer a Field seismologist a Proofreader de los 41 aos y [place] 31 aos.  Sin embargo, el mdico podr aconsejarle que lo haga antes, si tiene factores de riesgo para el cncer de colon.  Tambin puede recomendarle que use un kit de prueba para Hydrologist en la materia fecal.  Es posible que se use una pequea cmara en el extremo de un tubo para examinar directamente el colon (sigmoidoscopia o colonoscopia) a fin de Hydrographic surveyor formas tempranas de cncer colorrectal.  Los exmenes de rutina generalmente comienzan a los 36aos.  El examen directo del colon  se debe repetir cada 5 a 10aos hasta los 75aos. Sin embargo, es posible que se realicen exmenes con mayor frecuencia, si se detectan formas tempranas de plipos precancerosos o pequeos bultos. Cncer de piel   Revise la piel de la cabeza a los pies con regularidad.  Informe a su mdico si aparecen nuevos lunares o los que tiene se modifican, especialmente en su forma y color.  Tambin notifique al mdico si tiene un lunar que es ms grande que el tamao de una goma de lpiz.  Siempre use pantalla solar. Aplique pantalla solar de [place] D[place] y repetida a lo largo del Training and development officer.  Protjase usando mangas y Colgate Palmolive, un sombrero de ala ancha y gafas para el sol, siempre que se encuentre en el exterior. ENFERMEDADES CARDACAS, DIABETES E HIPERTENSIN ARTERIAL  La hipertensin arterial causa enfermedades cardacas y [place] el riesgo de ictus. La hipertensin arterial es ms probable en los siguientes casos:  Las personas que tienen la presin arterial en el extremo del rango normal (100-139/85-89 mm Hg).  Las personas con sobrepeso u obesidad.  Las Retail banker.  Si usted tiene entre 18 y 39 aos, debe medirse la presin arterial cada 3 a 5 aos. Si usted tiene 40 aos o ms, debe medirse la presin arterial Hewlett[employer]ard. Debe medirse la presin arterial dos veces: una vez cuando est en un hospital o una clnica y la otra vez cuando est en otro sitio. Registre el promedio de [employer] Stores. Para controlar su presin arterial cuando no est en un hospital o [place], puede usar lo siguiente:  Ardelia Mems mquina automtica para medir la presin arterial en una farmacia.  Un monitor para medir la presin arterial en el hogar.  Si tiene entre 15 y 53 aos, consulte a su mdico si debe tomar aspirina para prevenir el ictus.  Realcese exmenes de deteccin de la diabetes con regularidad. Esto incluye la toma de [place] de sangre para controlar el nivel de azcar en la sangre durante el [place].  Si tiene un peso normal y un bajo riesgo de padecer diabetes, realcese este anlisis cada tres aos despus de los 45aos.  Si tiene sobrepeso y un alto riesgo de padecer diabetes, considere someterse a este anlisis antes o con mayor frecuencia. PREVENCIN DE INFECCIONES HepatitisB   Si tiene un riesgo ms alto de Museum/gallery curator hepatitis B, debe someterse a un examen de deteccin de este virus. Se considera que tiene un alto riesgo de Museum/gallery curator hepatitis B si:  Naci en un pas donde la hepatitis B es frecuente. Pregntele a su mdico qu pases son considerados de Public affairs consultant.  Sus padres  nacieron en un pas de alto riesgo y usted no recibi una vacuna que lo proteja contra la hepatitis B (vacuna contra la hepatitis B).  [place].  [place] agujas para inyectarse drogas.  Vive con alguien que tiene hepatitis B.  Ha tenido sexo con alguien que tiene hepatitis B.  Recibe tratamiento de hemodilisis.  Toma ciertos medicamentos para el cncer, trasplante de rganos y afecciones autoinmunitarias. Hepatitis C   Se recomienda un anlisis de [place] para:  Todos los que nacieron entre 1945 y [phone].  Todas las personas que tengan un riesgo de haber contrado hepatitis C. Enfermedades de transmisin sexual (ETS).   Debe realizarse pruebas de deteccin de enfermedades de transmisin sexual (ETS), incluidas gonorrea y clamidia si:  Es sexualmente activo y es menor de 24aos.  Es mayor de 24aos, y  el mdico le informa que corre riesgo de tener este tipo de infecciones.  La actividad sexual ha cambiado desde que le hicieron la ltima prueba de deteccin y tiene un riesgo mayor de Best boy clamidia o Radio broadcast assistant. Pregntele al mdico si usted tiene riesgo.  Si no tiene el VIH, pero corre riesgo de infectarse por el virus, se recomienda tomar diariamente un medicamento recetado para evitar la infeccin. Esto se conoce como profilaxis previa a la exposicin. Se considera que est en riesgo si:  Es [place] sexualmente y no [place] preservativos habitualmente o no conoce el estado del VIH de sus Advertising copywriter.  Se inyecta drogas.  Es [place] sexualmente con Ardelia Mems pareja que tiene VIH. Consulte a su mdico para saber si tiene un alto riesgo de infectarse por el VIH. Si opta por comenzar la profilaxis previa a la exposicin, primero debe realizarse anlisis de deteccin del VIH. Luego, le harn anlisis cada 8mses mientras est tomando los medicamentos para la profilaxis previa a la exposicin. [facility] Si es premenopusica y puede quedar eRock Point solicite a su mdico asesoramiento  previo a la concepcin.  Si puede quedar embarazada, tome 400 a [id number](mcg) de cido fAnheuser-Busch  Si desea evitar el embarazo, hable con su mdico sobre el control de la natalidad (anticoncepcin). OSTEOPOROSIS Y MENOPAUSIA  La osteoporosis es una enfermedad en la que los huesos pierden los minerales y la fuerza por el avance de la edad. El resultado pueden ser fracturas graves en los hCorning El riesgo de osteoporosis puede identificarse con uArdelia Memsprueba de densidad sea.  Si tiene 65aos o ms, o si est en riesgo de sufrir osteoporosis y fracturas, pregunte a su mdico si debe someterse a exmenes.  Consulte a su mdico si debe tomar un suplemento de calcio o de vitamina D para reducir el riesgo de osteoporosis.  La menopausia puede presentar ciertos sntomas fsicos y rGaffer  La terapia de reemplazo hormonal puede reducir algunos de estos sntomas y rGaffer Consulte a su mdico para saber si la terapia de reemplazo hormonal es conveniente para usted. INSTRUCCIONES PARA EL CUIDADO EN EL HOGAR  Realcese los estudios de rutina de la salud, dentales y de lPublic librarian  MCalifornia  No consuma ningn producto que contenga tabaco, lo que incluye cigarrillos, tabaco de mHigher education careers advisero cPsychologist, sport and exercise  Si est embarazada, no beba alcohol.  Si est amamantando, reduzca el consumo de alcohol y la frecuencia con la que consume.  Si es mujer y no est embarazada limite el consumo de alcohol a no ms de 1 medida por da. Una medida equivale a 12onzas de cerveza, 5onzas de vino o 1onzas de bebidas alcohlicas de alta graduacin.  No consuma drogas.  No comparta agujas.  Solicite ayuda a su mdico si necesita apoyo o informacin para abandonar las drogas.  Informe a su mdico si a menudo se siente deprimido.  Notifique a su mdico si alguna vez ha sido vctima de abuso o si no se siente seguro en su hogar. Esta informacin no tiene cBuyer, retailel consejo del mdico. Asegrese de hacerle al mdico cualquier pregunta que tenga. Document Released: 11/08/2011 Document Revised: 12/10/2014 Document Reviewed: 08/23/2015 Elsevier Interactive Patient Education  2017 EReynolds American

## 2017-01-01 NOTE — Addendum Note (Signed)
Addended by: Royann Shivers A on: 01/01/2017 12:04 PM   Modules accepted: Orders

## 2017-01-01 NOTE — Assessment & Plan Note (Signed)
Discussed with patient - will monitor for now.

## 2017-01-03 LAB — CYTOLOGY - PAP
DIAGNOSIS: NEGATIVE
HPV (WINDOPATH): DETECTED — AB
HPV 16/18/45 GENOTYPING: NEGATIVE

## 2017-01-11 ENCOUNTER — Other Ambulatory Visit: Payer: Self-pay | Admitting: Family Medicine

## 2017-01-11 DIAGNOSIS — Z1231 Encounter for screening mammogram for malignant neoplasm of breast: Secondary | ICD-10-CM

## 2017-01-15 ENCOUNTER — Ambulatory Visit (INDEPENDENT_AMBULATORY_CARE_PROVIDER_SITE_OTHER): Payer: Commercial Managed Care - PPO | Admitting: Family Medicine

## 2017-01-15 ENCOUNTER — Encounter: Payer: Self-pay | Admitting: Family Medicine

## 2017-01-15 DIAGNOSIS — R8789 Other abnormal findings in specimens from female genital organs: Secondary | ICD-10-CM | POA: Insufficient documentation

## 2017-01-15 DIAGNOSIS — R87820 Cervical low risk human papillomavirus (HPV) DNA test positive: Secondary | ICD-10-CM | POA: Diagnosis not present

## 2017-01-15 DIAGNOSIS — R87618 Other abnormal cytological findings on specimens from cervix uteri: Secondary | ICD-10-CM | POA: Insufficient documentation

## 2017-01-15 NOTE — Patient Instructions (Signed)
Prueba del VPH (HPV Test) La prueba del virus del papiloma humano (VPH) se [place] para Hydrographic surveyor los tipos de infeccin por el VPH de Public affairs consultant. El VPH es un grupo de alrededor de 100 virus. Muchos de estos virus causan tumores dentro de los genitales, sobre ellos o a su alrededor. La mayora de los VPH provocan infecciones que suelen desaparecen sin tratamiento. Sin embargo, los tipos 6, 73, 52 y 109 del VPH se consideran de Public affairs consultant y pueden aumentar el riesgo de Chief Financial Officer de cuello del tero o de ano si la infeccin no se trata. La prueba del VPH identifica las cadenas de ADN (genticas) de la infeccin por el VPH, por lo que tambin se denomina prueba de [place] para [employer]. Aunque el VPH se Secretary/administrator en los hombres como en las mujeres, la prueba del VPH se [place] solo para Hydrographic surveyor un mayor riesgo de cncer en las mujeres:  Con una prueba de Papanicolaou anormal.  Despus del tratamiento de una prueba de Papanicolaou anormal.  Entre 30y 65aos.  Despus del tratamiento de una infeccin por el VPH de [employer]. La prueba del VPH se puede hacer al mismo tiempo que un examen plvico y Ardelia Mems prueba de Papanicolaou en mujeres de ms de 30 aos. Tanto la prueba del VPH como la prueba de Papanicolaou requieren Truddie Coco de clulas del cuello del tero. PREPARACIN PARA EL ESTUDIO  No se haga lavados vaginales ni se d un bao durante 24 a 48 horas antes de la prueba o como se lo haya indicado el mdico.  No tenga sexo durante 24 a 48 horas antes de la prueba o como se lo haya indicado el mdico.  Es posible que se le pida que reprograme la prueba si est menstruando.  Se le pedir que orine antes de la prueba. RESULTADOS Es su responsabilidad retirar el resultado del [place]. Consulte en el laboratorio o en el departamento en el que fue realizado el estudio cundo y cmo podr [employer]. Hable con el mdico si tiene [employer]. El resultado ser  negativo o positivo. Significado de los Microsoft de la prueba  Un resultado negativo de la prueba del VPH significa que no se detect el VPH, y es muy probable que no tenga el virus. Significado de los [employer] de la prueba  Un resultado positivo de la prueba del VPH indica que tiene el virus.  Si el resultado de la prueba [place] la presencia de [place] cadena del VPH de alto riesgo, puede tener mayor riesgo de Chief Financial Officer de cuello del tero o de ano si la infeccin no se trata.  Si se encuentran cadenas del VPH de bajo riesgo, es poco probable que tenga un alto riesgo de Chief Financial Officer. Hable con el mdico [employer]. El mdico [employer] para Optometrist un diagnstico y Teacher, adult education un plan de tratamiento adecuado para usted. Esta informacin no tiene Marine scientist el consejo del mdico. Asegrese de hacerle al mdico cualquier pregunta que tenga. Document Released: 03/07/2009 Document Revised: 12/10/2014 Document Reviewed: 04/06/2014 Elsevier Interactive Patient Education  2017 [employer].

## 2017-01-15 NOTE — Progress Notes (Signed)
   BP 100/60   Pulse 72   Temp 98.7 F (37.1 C) (Oral)   Wt 110 lb 12 oz (50.2 kg)   LMP 12/27/2016   BMI 21.27 kg/m    CC: f/u abnormal pap Subjective:    Patient ID: Olivia Estrada, female    DOB: November 10, 1976, 41 y.o.   MRN: [id number]  HPI: Olivia Estrada is a 41 y.o. female presenting on 01/15/2017 for Follow-up (discuss PAP)   See recent pap smear testing - endocervical zone present, negative for IEL or malignancy, + HPV but negative for HPV 16/18/45.   Denies h/o common warts, denies h/o genital warts.   Relevant past medical, surgical, family and social history reviewed and updated as indicated. Interim medical history since our last visit reviewed. Allergies and medications reviewed and updated. Current Outpatient Prescriptions on File Prior to Visit  Medication Sig  . Multiple Vitamin (MULTIVITAMIN) tablet Take 1 tablet by mouth daily.   No current facility-administered medications on file prior to visit.     Review of Systems Per HPI unless specifically indicated in ROS section     Objective:    BP 100/60   Pulse 72   Temp 98.7 F (37.1 C) (Oral)   Wt 110 lb 12 oz (50.2 kg)   LMP 12/27/2016   BMI 21.27 kg/m   Wt Readings from Last 3 Encounters:  01/15/17 110 lb 12 oz (50.2 kg)  01/01/17 111 lb (50.3 kg)  07/17/16 105 lb 12 oz (48 kg)    Physical Exam  Constitutional: She appears well-developed and well-nourished. No distress.  Nursing note and vitals reviewed.  Results for orders placed or performed in visit on 01/01/17  Cytology - PAP  Result Value Ref Range   Adequacy      Satisfactory for evaluation  endocervical/transformation zone component PRESENT.   Diagnosis      NEGATIVE FOR INTRAEPITHELIAL LESIONS OR MALIGNANCY.   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  Handout provided.  Rpt pap in 1 year with co-testing.           Follow up plan: No Follow-up on file.  Ria Bush, MD

## 2017-01-15 NOTE — Assessment & Plan Note (Signed)
Discussed HPV infection and f/u plan.  Handout provided.  Rpt pap in 1 year with co-testing.

## 2017-01-15 NOTE — Progress Notes (Signed)
Pre visit review using our clinic review tool, if applicable. No additional management support is needed unless otherwise documented below in the visit note. 

## 2017-02-18 ENCOUNTER — Other Ambulatory Visit: Payer: Self-pay | Admitting: Student

## 2017-02-18 DIAGNOSIS — M5137 Other intervertebral disc degeneration, lumbosacral region: Secondary | ICD-10-CM

## 2017-02-25 ENCOUNTER — Ambulatory Visit
Admission: RE | Admit: 2017-02-25 | Discharge: 2017-02-25 | Disposition: A | Discharge status: Home / Self Care | Payer: Commercial Managed Care - PPO | Source: Ambulatory Visit | Attending: Family Medicine | Admitting: Family Medicine

## 2017-02-25 DIAGNOSIS — Z1231 Encounter for screening mammogram for malignant neoplasm of breast: Secondary | ICD-10-CM

## 2017-02-25 LAB — HM MAMMOGRAPHY

## 2017-02-26 ENCOUNTER — Encounter: Payer: Self-pay | Admitting: *Deleted

## 2017-03-04 ENCOUNTER — Ambulatory Visit
Admission: RE | Admit: 2017-03-04 | Discharge: 2017-03-04 | Disposition: A | Discharge status: Home / Self Care | Payer: Commercial Managed Care - PPO | Source: Ambulatory Visit | Attending: Student | Admitting: Student

## 2017-03-04 DIAGNOSIS — M5137 Other intervertebral disc degeneration, lumbosacral region: Secondary | ICD-10-CM

## 2017-03-07 ENCOUNTER — Telehealth: Payer: Self-pay | Admitting: Family Medicine

## 2017-03-07 NOTE — Telephone Encounter (Signed)
Pt called regarding bill she received a bill for her CPE. She called billing and [employer] and was told it was due to coding. She received a bill for $28. She is requesting a cb to discuss.

## 2017-03-12 NOTE — Telephone Encounter (Signed)
Sent for review by Fayrene Helper and the billing team.  I have asked that they reach out to the patient to discuss.

## 2017-12-23 ENCOUNTER — Encounter: Payer: Commercial Managed Care - PPO | Admitting: Family Medicine

## 2018-01-06 ENCOUNTER — Other Ambulatory Visit (HOSPITAL_COMMUNITY)
Admission: RE | Admit: 2018-01-06 | Discharge: 2018-01-06 | Disposition: A | Discharge status: Home / Self Care | Payer: Commercial Managed Care - PPO | Source: Ambulatory Visit | Attending: Family Medicine | Admitting: Family Medicine

## 2018-01-06 ENCOUNTER — Encounter: Payer: Self-pay | Admitting: Family Medicine

## 2018-01-06 ENCOUNTER — Ambulatory Visit (INDEPENDENT_AMBULATORY_CARE_PROVIDER_SITE_OTHER): Payer: Commercial Managed Care - PPO | Admitting: Family Medicine

## 2018-01-06 VITALS — BP 116/64 | HR 69 | Temp 97.9°F | Ht 60.5 in | Wt 110.5 lb

## 2018-01-06 DIAGNOSIS — Z0001 Encounter for general adult medical examination with abnormal findings: Secondary | ICD-10-CM | POA: Insufficient documentation

## 2018-01-06 DIAGNOSIS — Z0000 Encounter for general adult medical examination without abnormal findings: Secondary | ICD-10-CM | POA: Diagnosis not present

## 2018-01-06 DIAGNOSIS — M79644 Pain in right finger(s): Secondary | ICD-10-CM

## 2018-01-06 DIAGNOSIS — R87820 Cervical low risk human papillomavirus (HPV) DNA test positive: Secondary | ICD-10-CM

## 2018-01-06 DIAGNOSIS — N644 Mastodynia: Secondary | ICD-10-CM | POA: Diagnosis not present

## 2018-01-06 DIAGNOSIS — Z131 Encounter for screening for diabetes mellitus: Secondary | ICD-10-CM

## 2018-01-06 DIAGNOSIS — Z13220 Encounter for screening for lipoid disorders: Secondary | ICD-10-CM

## 2018-01-06 DIAGNOSIS — R87610 Atypical squamous cells of undetermined significance on cytologic smear of cervix (ASC-US): Secondary | ICD-10-CM | POA: Insufficient documentation

## 2018-01-06 DIAGNOSIS — Z124 Encounter for screening for malignant neoplasm of cervix: Secondary | ICD-10-CM | POA: Diagnosis not present

## 2018-01-06 LAB — LIPID PANEL
CHOL/HDL RATIO: 3
CHOLESTEROL: 199 mg/dL (ref 0–200)
HDL: 69.9 mg/dL (ref >39.00)
LDL Cholesterol: 119 mg/dL — ABNORMAL HIGH (ref 0–99)
NonHDL: 129.04
TRIGLYCERIDES: 51 mg/dL (ref 0.0–149.0)
VLDL: 10.2 mg/dL (ref 0.0–40.0)

## 2018-01-06 LAB — BASIC METABOLIC PANEL
BUN: 11 mg/dL (ref 6–23)
CHLORIDE: 104 meq/L (ref 96–112)
CO2: 26 meq/L (ref 19–32)
CREATININE: 0.57 mg/dL (ref 0.40–1.20)
Calcium: 9 mg/dL (ref 8.4–10.5)
GFR: 123.02 mL/min (ref >60.00)
GLUCOSE: 100 mg/dL — AB (ref 70–99)
POTASSIUM: 3.5 meq/L (ref 3.5–5.1)
Sodium: 138 mEq/L (ref 135–145)

## 2018-01-06 NOTE — Addendum Note (Signed)
Addended by: Brenton Grills on: 03/07/9976 41:42 AM   Modules accepted: Orders

## 2018-01-06 NOTE — Assessment & Plan Note (Signed)
Preventative protocols reviewed and updated unless pt declined. Discussed healthy diet and lifestyle.  

## 2018-01-06 NOTE — Addendum Note (Signed)
Addended by: Ria Bush on: 01/06/2018 09:16 AM   Modules accepted: Orders

## 2018-01-06 NOTE — Assessment & Plan Note (Addendum)
With hardening of lateral R breast - pt states this is her norm before period, then hardness resolves. She had similar symptoms last year at time of 3D screening mammogram. Offered option of dx mammo/US, pt feels comfortable waiting until screening mammo due at end of March. Anticipate fibrocystic breast changes.

## 2018-01-06 NOTE — Assessment & Plan Note (Signed)
Presumed arthritis type pain - improved with daily ginger, apple cider vinegar.

## 2018-01-06 NOTE — Patient Instructions (Addendum)
Labs today. Siga comida sana y [place] regular. Regresar en 1 ao para proximo examen fisico.  Gusto verla hoy.  Olivia Estrada (Health Maintenance, Female) Un estilo de vida saludable y los cuidados preventivos pueden favorecer considerablemente a la salud y Musician. Pregunte a su mdico cul es el cronograma de exmenes peridicos apropiado para usted. Esta es una buena oportunidad para consultarlo sobre cmo prevenir enfermedades y [place] sano. Adems de los controles, hay muchas otras cosas que puede hacer usted mismo. Los expertos han realizado numerosas investigaciones [employer] cambios en el estilo de vida y las medidas de prevencin que, [place], lo ayudarn a mantenerse sano. Solicite a su mdico ms informacin. EL PESO Y LA DIETA Consuma una dieta saludable.  Asegrese de [employer] verduras, frutas, productos lcteos de bajo contenido de [place] y Advertising account planner.  No consuma muchos alimentos de alto contenido de grasas slidas, azcares agregados o sal.  Realice actividad fsica con regularidad. Esta es una de las prcticas ms importantes que puede hacer por su salud. ? La Delorise Shiner de los adultos deben hacer ejercicio durante al menos 166mnutos por semana. El ejercicio debe aumentar la frecuencia cardaca y pActorla transpiracin (ejercicio de [place]. ? La mayora de los adultos tambin deben hacer ejercicios de elongacin al mToysRusveces a la semana. Agregue esto al su plan de ejercicio de intensidad moderada. Mantenga un peso saludable.  El ndice de masa corporal ([facility] es una medida que puede utilizarse para identificar posibles problemas de pPerkins Proporciona una estimacin de la grasa corporal basndose en el peso y la altura. Su mdico puede ayudarle a dRadiation protection practitionerIFargoy a lScientist, forensico mTheatre managerun peso saludable.  Para las mujeres de 20aos o ms: ? Un IEagle Physicians And Associates Pamenor de 18,5 se considera bajo peso. ? Un IHeartland Regional Medical Centerentre  18,5 y 24,9 es normal. ? Un IEast Paris Surgical Center LLCentre 25 y 29,9 se considera sobrepeso. ? Un IMC de 30 o ms se considera obesidad. Observe los niveles de colesterol y lpidos en la sangre.  Debe comenzar a rEnglish as a second language teacherde lpidos y cResearch officer, trade unionen la sangre a los 20aos y luego repetirlos cada 522aos  Es posible que nAutomotive engineerlos niveles de colesterol con mayor frecuencia si: ? Sus niveles de lpidos y colesterol son altos. ? Es mayor de 518ACZ ? Presenta un alto riesgo de padecer enfermedades cardacas. DETECCIN DE CNCER Cncer de pulmn  Se recomienda realizar exmenes de deteccin de cncer de pulmn a personas adultas entre 567y 874aos que estn en riesgo de dHorticulturist, commercialde pulmn por sus antecedentes de consumo de tabaco.  Se recomienda una tomografa computarizada de baja dosis de los pulmones todos los aos a las personas que: ? Fuman actualmente. ? Hayan dejado el hbito en algn momento en los ltimos 15aos. ? Hayan fumado durante 30aos un paquete diario. Un paquete-ao equivale a fumar un promedio de un paquete de cigarrillos diario durante un ao.  Los exmenes de deteccin anuales deben continuar hasta que hayan pasado 15aos desde que dej de fumar.  Ya no debern realizarse si tiene un problema de salud que le impida recibir tratamiento para eScience writerde pulmn. Cncer de mama  Practique la autoconciencia de la mama. Esto significa reconocer la apariencia normal de sus mamas y cmo las siente.  Tambin significa realizar autoexmenes regulares de lJohnson & Johnson Informe a su mdico sobre cualquier cambio, sin importar cun pequeo sea.  Si tiene entre 20 y 319  aos, un mdico debe realizarle un examen clnico de las mamas como parte del examen regular de [place], cada 1 a 3aos.  Si tiene 40aos o ms, debe Information systems manager clnico de las Microsoft. Tambin considere realizarse una [place]) todos los [place].  Si  tiene antecedentes familiares de cncer de mama, hable con su mdico para someterse a un estudio gentico.  Si tiene alto riesgo de Chief Financial Officer de mama, hable con su mdico para someterse a Public house manager y [employer].  La evaluacin del gen del cncer de mama (BRCA) se recomienda a mujeres que tengan familiares con cnceres relacionados con el BRCA. Los cnceres relacionados con el BRCA incluyen los siguientes: ? [place]. ? Ovario. ? Trompas. ? Cnceres de peritoneo.  Los resultados de la evaluacin determinarn la necesidad de asesoramiento gentico y de [place] de BRCA1 y BRCA2. Cncer de cuello del tero El mdico puede recomendarle que se haga pruebas peridicas de deteccin de cncer de los rganos de la pelvis (ovarios, tero y vagina). Estas pruebas incluyen un examen plvico, que abarca controlar si se produjeron cambios microscpicos en la superficie del cuello del tero (prueba de Papanicolaou). Pueden recomendarle que se haga estas pruebas cada 3aos, a partir de los 21aos.  A las mujeres que tienen entre 30 y 69aos, los mdicos pueden recomendarles que se sometan a exmenes plvicos y pruebas de Papanicolaou cada 76aos, o a la prueba de Papanicolaou y el examen plvico en combinacin con estudios de deteccin del virus del papiloma humano (VPH) cada 5aos. Algunos tipos de VPH aumentan el riesgo de Chief Financial Officer de cuello del tero. La prueba para la deteccin del VPH tambin puede realizarse a mujeres de cualquier edad cuyos resultados de la prueba de Papanicolaou no sean claros.  Es posible que otros mdicos no recomienden exmenes de deteccin a mujeres no embarazadas que se consideran sujetos de bajo riesgo de Chief Financial Officer de pelvis y que no tienen sntomas. Pregntele al mdico si un examen plvico de deteccin es adecuado para usted.  Si ha recibido un tratamiento para Science writer cervical o una enfermedad que podra causar cncer, necesitar  realizarse una prueba de Papanicolaou y controles durante al menos 69 aos de concluido el [place]. Si no se ha hecho el Papanicolaou con regularidad, debern volver a evaluarse los factores de riesgo (como tener un nuevo compaero sexual), para Teacher, adult education si debe realizarse los estudios nuevamente. Algunas mujeres sufren problemas mdicos que aumentan la probabilidad de Museum/gallery curator cncer de cuello del tero. En estos casos, el mdico podr [employer] se realicen controles y pruebas de Papanicolaou con ms frecuencia. Cncer colorrectal  Este tipo de cncer puede detectarse y a menudo prevenirse.  Por lo general, los estudios de rutina se deben Medical laboratory scientific officer a Field seismologist a Proofreader de los 53 aos y [place] 25 aos.  Sin embargo, el mdico podr aconsejarle que lo haga antes, si tiene factores de riesgo para el cncer de colon.  Tambin puede recomendarle que use un kit de prueba para Hydrologist en la materia fecal.  Es posible que se use una pequea cmara en el extremo de un tubo para examinar directamente el colon (sigmoidoscopia o colonoscopia) a fin de Hydrographic surveyor formas tempranas de cncer colorrectal.  Los exmenes de rutina generalmente comienzan a los 39aos.  El examen directo del colon se debe repetir cada 5 a 10aos hasta los 75aos. Sin embargo, es posible que se realicen exmenes  con mayor frecuencia, si se detectan formas tempranas de plipos precancerosos o pequeos bultos. Cncer de piel  Revise la piel de la cabeza a los pies con regularidad.  Informe a su mdico si aparecen nuevos lunares o los que tiene se modifican, especialmente en su forma y color.  Tambin notifique al mdico si tiene un lunar que es ms grande que el tamao de una goma de lpiz.  Siempre use pantalla solar. Aplique pantalla solar de Kerry Dory y repetida a lo largo del Training and development officer.  Protjase usando mangas y [employer], un sombrero de ala ancha y gafas para el sol, siempre que se encuentre en el  exterior. ENFERMEDADES CARDACAS, DIABETES E HIPERTENSIN ARTERIAL  La hipertensin arterial causa enfermedades cardacas y [place] el riesgo de ictus. La hipertensin arterial es ms probable en los siguientes casos: ? Las personas que tienen la presin arterial en el extremo del rango normal (100-139/85-89 mm Hg). ? [employer] con sobrepeso u obesidad. ? Scientist, water quality.  Si usted tiene entre 18 y 39 aos, debe medirse la presin arterial cada 3 a 5 aos. Si usted tiene 40 aos o ms, debe medirse la presin arterial [employer]. Debe medirse la presin arterial dos veces: una vez cuando est en un hospital o una clnica y la otra vez cuando est en otro sitio. Registre el promedio de [employer]. Para controlar su presin arterial cuando no est en un hospital o [place], puede usar lo siguiente: ? Jorje Guild automtica para medir la presin arterial en una farmacia. ? Un monitor para medir la presin arterial en el hogar.  Si tiene entre 76 y 59 aos, consulte a su mdico si debe tomar aspirina para prevenir el ictus.  Realcese exmenes de deteccin de la diabetes con regularidad. Esto incluye la toma de [place] de sangre para controlar el nivel de azcar en la sangre durante el [place]. ? Si tiene un peso normal y un bajo riesgo de padecer diabetes, realcese este anlisis cada tres aos despus de los 45aos. ? Si tiene sobrepeso y un alto riesgo de padecer diabetes, considere someterse a este anlisis antes o con mayor frecuencia. PREVENCIN DE INFECCIONES HepatitisB  Si tiene un riesgo ms alto de Museum/gallery curator hepatitis B, debe someterse a un examen de deteccin de este virus. Se considera que tiene un alto riesgo de contraer hepatitis B si: ? Naci en un pas donde la hepatitis B es frecuente. Pregntele a su mdico qu pases son considerados de Public affairs consultant. ? Sus padres nacieron en un pas de alto riesgo y usted no recibi una vacuna que lo proteja contra  la hepatitis B (vacuna contra la hepatitis B). ? [place]. ? [place] agujas para inyectarse drogas. ? Vive con alguien que tiene hepatitis B. ? Ha tenido sexo con alguien que tiene hepatitis B. ? Recibe tratamiento de hemodilisis. ? Toma ciertos medicamentos para el cncer, trasplante de rganos y afecciones autoinmunitarias. Hepatitis C  Se recomienda un anlisis de [place] para: ? [employer] 1945 y 1965. ? Todas las personas que tengan un riesgo de haber contrado hepatitis C. Enfermedades de transmisin sexual (ETS).  Debe realizarse pruebas de deteccin de enfermedades de transmisin sexual (ETS), incluidas gonorrea y clamidia si: ? Es sexualmente activo y es menor de 32KGU. ? Es mayor de 24aos, y Investment banker, operational informa que corre riesgo de tener este tipo de infecciones. ? Calla Kicks sexual ha [place] desde [place]  hicieron la ltima prueba de deteccin y tiene un riesgo mayor de tener clamidia o Radio broadcast assistant. Pregntele al mdico si usted tiene riesgo.  Si no tiene el VIH, pero corre riesgo de infectarse por el virus, se recomienda tomar diariamente un medicamento recetado para evitar la infeccin. Esto se conoce como profilaxis previa a la exposicin. Se considera que est en riesgo si: ? Es [place] sexualmente y no [place] preservativos habitualmente o no conoce el estado del VIH de sus Advertising copywriter. ? Se inyecta drogas. ? Es [place] sexualmente con Ardelia Mems pareja que tiene VIH. Consulte a su mdico para saber si tiene un alto riesgo de infectarse por el VIH. Si opta por comenzar la profilaxis previa a la exposicin, primero debe realizarse anlisis de deteccin del VIH. Luego, le harn anlisis cada 40mses mientras est tomando los medicamentos para la profilaxis previa a la exposicin. [facility] Si es premenopusica y puede quedar eP[place] solicite a su mdico asesoramiento previo a la concepcin.  Si puede quedar embarazada, tome 400 a 8083mrogramos (mcg)  de cido flAnheuser-Busch Si desea evitar el embarazo, hable con su mdico sobre el control de la natalidad (anticoncepcin). OSTEOPOROSIS Y MENOPAUSIA  La osteoporosis es una enfermedad en la que los huesos pierden los minerales y la fuerza por el avance de la edad. El resultado pueden ser fracturas graves en los huNorth RiverEl riesgo de osteoporosis puede identificarse con unArdelia Memsrueba de densidad sea.  Si tiene 65aos o ms, o si est en riesgo de sufrir osteoporosis y fracturas, pregunte a su mdico si debe someterse a exmenes.  Consulte a su mdico si debe tomar un suplemento de calcio o de vitamina D para reducir el riesgo de osteoporosis.  La menopausia puede presentar ciertos sntomas fsicos y riGaffer La terapia de reemplazo hormonal puede reducir algunos de estos sntomas y riGafferConsulte a su mdico para saber si la terapia de reemplazo hormonal es conveniente para usted. INSTRUCCIONES PARA EL CUIDADO EN EL HOGAR  Realcese los estudios de rutina de la salud, dentales y de laPublic librarian MaLake Tapawingo No consuma ningn producto que contenga tabaco, lo que incluye cigarrillos, tabaco de maHigher education careers adviser ciPsychologist, sport and exercise Si est embarazada, no beba alcohol.  Si est amamantando, reduzca el consumo de alcohol y la frecuencia con la que consume.  Si es mujer y no est embarazada limite el consumo de alcohol a no ms de 1 medida por da. Una medida equivale a 12onzas de cerveza, 5onzas de vino o 1onzas de bebidas alcohlicas de alta graduacin.  No consuma drogas.  No comparta agujas.  Solicite ayuda a su mdico si necesita apoyo o informacin para abandonar las drogas.  Informe a su mdico si a menudo se siente deprimido.  Notifique a su mdico si alguna vez ha sido vctima de abuso o si no se siente seguro en su hogar. Esta informacin no tiene coMarine scientistl consejo del mdico. Asegrese de hacerle al mdico cualquier pregunta  que tenga. Document Released: 11/08/2011 Document Revised: 12/10/2014 Document Reviewed: 08/23/2015 Elsevier Interactive Patient Education  20[employer]

## 2018-01-06 NOTE — Progress Notes (Addendum)
BP 116/64 (BP Location: Left Arm, Patient Position: Sitting, Cuff Size: Normal)   Pulse 69   Temp 97.9 F (36.6 C) (Oral)   Ht 5' 0.5" (1.537 m)   Wt 110 lb 8 oz (50.1 kg)   LMP 12/07/2017   SpO2 97%   BMI 21.23 kg/m    CC: CPE Subjective:    Patient ID: Olivia Estrada, female    DOB: 1976-02-11, 42 y.o.   MRN: [id number]  HPI: Olivia Estrada is a 42 y.o. female presenting on 01/06/2018 for Annual Exam (Has form to be signed)   Recent MRI showing L4/5 left lateral annular tear. Treating this with CBD oil with benefit.  Finds ginger helps arthritis pains.   Preventative: Well woman - pap 2018 HPV positive, HR neg otherwise normal. All normal in the past.  Mammogram - mammo WNL 01/2017. Does breast exams at home. Flu - received this year - since then trouble sleeping at night Tdap - 2017 Seat belt use discussed Sunscreen use discussed. No changing moles on skin.  LMP 12/07/2017. Regular periods.  Non smoker Alcohol - socially  Lives with husband and son, 1 dog and parrot Occupation: Materials engineer and house cleaning, works 4d/wk at [employer]: walks 1 hour every day Diet: 8 glasses of water daily, fruits/vegteables daily  Relevant past medical, surgical, family and social history reviewed and updated as indicated. Interim medical history since our last visit reviewed. Allergies and medications reviewed and updated. Outpatient Medications Prior to Visit  Medication Sig Dispense Refill  . Multiple Vitamin (MULTIVITAMIN) tablet Take 1 tablet by mouth daily.     No facility-administered medications prior to visit.      Per HPI unless specifically indicated in ROS section below Review of Systems  Constitutional: Negative for activity change, appetite change, chills, fatigue, fever and unexpected weight change.  HENT: Negative for hearing loss.   Eyes: Negative for visual disturbance.  Respiratory: Negative for cough, chest tightness, shortness of breath and  wheezing.   Cardiovascular: Negative for chest pain, palpitations and leg swelling.  Gastrointestinal: Positive for blood in stool (h/o hemorrhoids). Negative for abdominal distention, abdominal pain, constipation, diarrhea, nausea and vomiting.  Genitourinary: Negative for difficulty urinating and hematuria.  Musculoskeletal: Negative for arthralgias, myalgias and neck pain.  Skin: Negative for rash.  Neurological: Negative for dizziness, seizures, syncope and headaches.  Hematological: Negative for adenopathy. Does not bruise/bleed easily.  Psychiatric/Behavioral: Negative for dysphoric mood. The patient is not nervous/anxious.        Objective:    BP 116/64 (BP Location: Left Arm, Patient Position: Sitting, Cuff Size: Normal)   Pulse 69   Temp 97.9 F (36.6 C) (Oral)   Ht 5' 0.5" (1.537 m)   Wt 110 lb 8 oz (50.1 kg)   LMP 12/07/2017   SpO2 97%   BMI 21.23 kg/m   Wt Readings from Last 3 Encounters:  01/06/18 110 lb 8 oz (50.1 kg)  01/15/17 110 lb 12 oz (50.2 kg)  01/01/17 111 lb (50.3 kg)    Physical Exam  Constitutional: She is oriented to person, place, and time. She appears well-developed and well-nourished. No distress.  HENT:  Head: Normocephalic and atraumatic.  Right Ear: Hearing, tympanic membrane, external ear and ear canal normal.  Left Ear: Hearing, tympanic membrane, external ear and ear canal normal.  Nose: Nose normal.  Mouth/Throat: Uvula is midline, oropharynx is clear and moist and mucous membranes are normal. No oropharyngeal exudate, posterior oropharyngeal edema or  posterior oropharyngeal erythema.  Eyes: Conjunctivae and EOM are normal. Pupils are equal, round, and reactive to light. No scleral icterus.  Neck: Normal range of motion. Neck supple. No thyromegaly present.  Cardiovascular: Normal rate, regular rhythm, normal heart sounds and intact distal pulses.  No murmur heard. Pulses:      Radial pulses are 2+ on the right side, and 2+ on the left  side.  Pulmonary/Chest: Effort normal and breath sounds normal. No respiratory distress. She has no wheezes. She has no rales. Right breast exhibits mass (R lateral breast hardened area) and tenderness. Right breast exhibits no inverted nipple, no nipple discharge and no skin change. Left breast exhibits no inverted nipple, no mass, no nipple discharge, no skin change and no tenderness. Breasts are symmetrical.  Abdominal: Soft. Bowel sounds are normal. She exhibits no distension and no mass. There is no tenderness. There is no rebound and no guarding. Hernia confirmed negative in the right inguinal area and confirmed negative in the left inguinal area.  Genitourinary: Vagina normal and uterus normal. Pelvic exam was performed with patient supine. There is no rash, tenderness or lesion on the right labia. There is no rash, tenderness or lesion on the left labia. Cervix exhibits no motion tenderness, no discharge and no friability. Right adnexum displays no mass, no tenderness and no fullness. Left adnexum displays no mass, no tenderness and no fullness. No signs of injury around the vagina.  Genitourinary Comments: Pap performed  Musculoskeletal: Normal range of motion. She exhibits no edema.  Lymphadenopathy:       Head (right side): No submental, no submandibular, no tonsillar, no preauricular and no posterior auricular adenopathy present.       Head (left side): No submental, no submandibular, no tonsillar, no preauricular and no posterior auricular adenopathy present.    She has no cervical adenopathy.    She has no axillary adenopathy.       Right axillary: No lateral adenopathy present.       Left axillary: No lateral adenopathy present.      Right: No supraclavicular adenopathy present.       Left: No supraclavicular adenopathy present.  Neurological: She is alert and oriented to person, place, and time.  CN grossly intact, station and gait intact  Skin: Skin is warm and dry. No rash noted.    Psychiatric: She has a normal mood and affect. Her behavior is normal. Judgment and thought content normal.  Nursing note and vitals reviewed.  Results for orders placed or performed in visit on 02/26/17  [facility] MAMMOGRAPHY  Result Value Ref Range   HM Mammogram 0-4 Bi-Rad 0-4 Bi-Rad, Self Reported Normal      Assessment & Plan:   Problem List Items Addressed This Visit    Breast pain, right    With hardening of lateral R breast - pt states this is her norm before period, then hardness resolves. She had similar symptoms last year at time of 3D screening mammogram. Offered option of dx mammo/US, pt feels comfortable waiting until screening mammo due at end of March. Anticipate fibrocystic breast changes.       Cervical low risk human papillomavirus (HPV) DNA test positive    Rpt pap today.       Health maintenance examination - Primary    Preventative protocols reviewed and updated unless pt declined. Discussed healthy diet and lifestyle.       Pain of right thumb    Presumed arthritis type pain - improved  with daily ginger, apple cider vinegar.        Other Visit Diagnoses    Lipid screening       Relevant Orders   Lipid panel   Diabetes mellitus screening       Relevant Orders   Basic metabolic panel       Follow up plan: Return in about 1 year (around 01/06/2019) for annual exam, prior fasting for blood work.  Ria Bush, MD

## 2018-01-06 NOTE — Assessment & Plan Note (Signed)
Rpt pap today.

## 2018-01-09 LAB — CYTOLOGY - PAP
Diagnosis: UNDETERMINED — AB
HPV 16/18/45 genotyping: NEGATIVE
HPV: DETECTED — AB

## 2018-01-11 ENCOUNTER — Other Ambulatory Visit: Payer: Self-pay | Admitting: Family Medicine

## 2018-01-11 DIAGNOSIS — R87610 Atypical squamous cells of undetermined significance on cytologic smear of cervix (ASC-US): Secondary | ICD-10-CM

## 2018-01-11 DIAGNOSIS — R87810 Cervical high risk human papillomavirus (HPV) DNA test positive: Principal | ICD-10-CM

## 2018-01-14 ENCOUNTER — Other Ambulatory Visit: Payer: Self-pay | Admitting: Family Medicine

## 2018-01-14 DIAGNOSIS — Z1231 Encounter for screening mammogram for malignant neoplasm of breast: Secondary | ICD-10-CM

## 2018-01-27 ENCOUNTER — Encounter: Payer: Self-pay | Admitting: Obstetrics & Gynecology

## 2018-01-27 ENCOUNTER — Ambulatory Visit (INDEPENDENT_AMBULATORY_CARE_PROVIDER_SITE_OTHER): Payer: Commercial Managed Care - PPO | Admitting: Obstetrics & Gynecology

## 2018-01-27 VITALS — BP 100/60 | HR 68 | Ht 60.0 in | Wt 112.0 lb

## 2018-01-27 DIAGNOSIS — R8789 Other abnormal findings in specimens from female genital organs: Secondary | ICD-10-CM | POA: Diagnosis not present

## 2018-01-27 DIAGNOSIS — R87810 Cervical high risk human papillomavirus (HPV) DNA test positive: Secondary | ICD-10-CM | POA: Diagnosis not present

## 2018-01-27 DIAGNOSIS — R87618 Other abnormal cytological findings on specimens from cervix uteri: Secondary | ICD-10-CM

## 2018-01-27 DIAGNOSIS — R87610 Atypical squamous cells of undetermined significance on cytologic smear of cervix (ASC-US): Secondary | ICD-10-CM

## 2018-01-27 NOTE — Addendum Note (Signed)
Addended by: Gae Dry on: 01/27/2018 05:27 PM   Modules accepted: Orders

## 2018-01-27 NOTE — Progress Notes (Signed)
Referring Provider:  Biagio Borg  HPI:  Olivia Estrada is a 42 y.o.  [phone]  who presents today for evaluation and management of abnormal cervical cytology.    Dysplasia History:  ASCUS; HPV pos  ROS:  Pertinent items noted in HPI and remainder of comprehensive ROS otherwise negative.  OB History  Gravida Para Term Preterm AB Living  2 2 2     2   SAB TAB Ectopic Multiple Live Births               # Outcome Date GA Lbr Len/2nd Weight Sex Delivery Anes PTL Lv  2 Term           1 Term              Past Medical History:  Diagnosis Date  . Arthritis   . History of chicken pox    Past Surgical History:  Procedure Laterality Date  . TUBAL LIGATION  2003   SOCIAL HISTORY: Social History   Substance and Sexual Activity  Alcohol Use Yes  . Alcohol/week: 0.0 oz   Comment: socially   Social History   Substance and Sexual Activity  Drug Use No   Family History  Problem Relation Age of Onset  . Diabetes Father   . Diabetes Maternal Grandfather   . Cancer Mother 50       kidney (non smoker)  . CAD Neg Hx   . Stroke Neg Hx   . Breast cancer Neg Hx    ALLERGIES:  Patient has no known allergies.  Current Outpatient Medications on File Prior to Visit  Medication Sig Dispense Refill  . Multiple Vitamin (MULTIVITAMIN) tablet Take 1 tablet by mouth daily.     No current facility-administered medications on file prior to visit.    Physical Exam: -Vitals:  BP 100/60   Pulse 68   Ht 5' (1.524 m)   Wt 112 lb (50.8 kg)   LMP 01/08/2018   BMI 21.87 kg/m  GEN: WD, WN, NAD.  A+ O x 3, good mood and affect. ABD:  NT, ND.  Soft, no masses.  No hernias noted.   Pelvic:   Vulva: Normal appearance.  No lesions.  Vagina: No lesions or abnormalities noted.  Support: Normal pelvic support.  Urethra No masses tenderness or scarring.  Meatus Normal size without lesions or prolapse.  Cervix: See below.  Anus: Normal exam.  No lesions.  Perineum: Normal exam.  No lesions.          Bimanual   Uterus: Normal size.  Non-tender.  Mobile.  AV.  Adnexae: No masses.  Non-tender to palpation.  Cul-de-sac: Negative for abnormality.   PROCEDURE: 1.  Urine Pregnancy Test:  not done 2.  Colposcopy performed with 4% acetic acid after verbal consent obtained                                      -Aceto-white Lesions Location(s): 12 o'clock.              -Biopsy performed at 12, 5 o'clock               -ECC indicated and performed: Yes.       -Biopsy sites made hemostatic with pressure, AgNO3, and/or Monsel's solution   -Satisfactory colposcopy: Yes.      -Evidence of Invasive cervical CA :  NO  ASSESSMENT:  Olivia Estrada  is a 42 y.o. G2P2002 here for  1. Pap smear abnormality of cervix/human papillomavirus (HPV) positive   2. ASCUS with positive high risk HPV cervical    PLAN: 1.  I discussed the grading system of pap smears and HPV high risk viral types.  We will discuss and base management after colpo results return. 2. Follow up PAP 6 months, vs intervention if high grade dysplasia identified 3. Treatment of persistantly abnormal PAP smears and cervical dysplasia, even mild, is discussed w pt today in detail, as well as the pros and cons of Cryo and LEEP procedures. Will consider and discuss after results.     Barnett Applebaum, MD, [facility], [facility] 01/27/2018  1:36 PM

## 2018-01-27 NOTE — Patient Instructions (Signed)

## 2018-01-31 LAB — PATHOLOGY

## 2018-02-03 ENCOUNTER — Telehealth: Payer: Self-pay

## 2018-02-03 NOTE — Telephone Encounter (Signed)
Pt inquiring about results from [facility] 01/27/18. GY#[phone]

## 2018-02-04 NOTE — Telephone Encounter (Signed)
Patient is calling to follow up with her labs results

## 2018-02-04 NOTE — Progress Notes (Signed)
Sch follow up w PAP for 6 mos w PH plz.  She is aware

## 2018-02-04 NOTE — Telephone Encounter (Signed)
rph discussed results with pt

## 2018-02-04 NOTE — Progress Notes (Signed)
LM

## 2018-03-03 ENCOUNTER — Ambulatory Visit
Admission: RE | Admit: 2018-03-03 | Discharge: 2018-03-03 | Disposition: A | Discharge status: Home / Self Care | Payer: Commercial Managed Care - PPO | Source: Ambulatory Visit | Attending: Family Medicine | Admitting: Family Medicine

## 2018-03-03 DIAGNOSIS — Z1231 Encounter for screening mammogram for malignant neoplasm of breast: Secondary | ICD-10-CM | POA: Diagnosis present

## 2018-03-03 LAB — HM MAMMOGRAPHY

## 2018-03-06 ENCOUNTER — Encounter: Payer: Self-pay | Admitting: Family Medicine

## 2018-08-04 ENCOUNTER — Ambulatory Visit: Payer: Commercial Managed Care - PPO | Admitting: Obstetrics & Gynecology

## 2018-08-11 ENCOUNTER — Encounter: Payer: Self-pay | Admitting: Obstetrics & Gynecology

## 2018-08-11 ENCOUNTER — Other Ambulatory Visit (HOSPITAL_COMMUNITY)
Admission: RE | Admit: 2018-08-11 | Discharge: 2018-08-11 | Disposition: A | Discharge status: Home / Self Care | Payer: Commercial Managed Care - PPO | Source: Ambulatory Visit | Attending: Obstetrics & Gynecology | Admitting: Obstetrics & Gynecology

## 2018-08-11 ENCOUNTER — Ambulatory Visit (INDEPENDENT_AMBULATORY_CARE_PROVIDER_SITE_OTHER): Payer: Commercial Managed Care - PPO | Admitting: Obstetrics & Gynecology

## 2018-08-11 VITALS — BP 100/60 | Ht 60.0 in | Wt 118.0 lb

## 2018-08-11 DIAGNOSIS — N870 Mild cervical dysplasia: Secondary | ICD-10-CM | POA: Diagnosis present

## 2018-08-11 NOTE — Progress Notes (Signed)
HPI:  Patient is a 42 y.o. [id number] presenting for follow up evaluation of abnormal PAP smear in the past.  Her last PAP was 6 months ago and was abnormal: ASCUS, Pos HPV. She has had a prior colposcopy. Prior biopsies (if done) were CIN I (6 mos ago).  Pt also c/o left axilla tender spot for four mos, no breast mass just in arm pit centrally.  No radiation or assoc sx's.  Constant and no change.  MMG in Feb normal.  PMHx: She  has a past medical history of Arthritis and History of chicken pox. Also,  has a past surgical history that includes Tubal ligation (2003)., family history includes Cancer (age of onset: 74) in her mother; Diabetes in her father and maternal grandfather.,  reports that she has never smoked. She has never used smokeless tobacco. She reports that she drinks alcohol. She reports that she does not use drugs.  She has a current medication list which includes the following prescription(s): multivitamin. Also, has No Known Allergies.  Review of Systems  Constitutional: Negative for chills, fever and malaise/fatigue.  HENT: Negative for congestion, sinus pain and sore throat.   Eyes: Negative for blurred vision and pain.  Respiratory: Negative for cough and wheezing.   Cardiovascular: Negative for chest pain and leg swelling.  Gastrointestinal: Negative for abdominal pain, constipation, diarrhea, heartburn, nausea and vomiting.  Genitourinary: Positive for frequency. Negative for dysuria, hematuria and urgency.  Musculoskeletal: Positive for joint pain. Negative for back pain, myalgias and neck pain.  Skin: Negative for itching and rash.  Neurological: Negative for dizziness, tremors and weakness.  Endo/Heme/Allergies: Does not bruise/bleed easily.  Psychiatric/Behavioral: Negative for depression. The patient is not nervous/anxious and does not have insomnia.   All other systems reviewed and are negative.   Objective: BP 100/60   Ht 5' (1.524 m)   Wt 118 lb (53.5 kg)    LMP 07/22/2018   BMI 23.05 kg/m  Filed Weights   08/11/18 0852  Weight: 118 lb (53.5 kg)   Body mass index is 23.05 kg/m.  Physical examination Physical Exam  Constitutional: She is oriented to person, place, and time. She appears well-developed and well-nourished. No distress.  Genitourinary: Rectum normal, vagina normal and uterus normal. Pelvic exam was performed with patient supine. There is no rash or lesion on the right labia. There is no rash or lesion on the left labia. Vagina exhibits no lesion. No bleeding in the vagina. Right adnexum does not display mass and does not display tenderness. Left adnexum does not display mass and does not display tenderness. Cervix does not exhibit motion tenderness, lesion, friability or polyp.   Uterus is mobile and midaxial. Uterus is not enlarged or exhibiting a mass.  HENT:  Head: Normocephalic and atraumatic. Head is without laceration.  Right Ear: Hearing normal.  Left Ear: Hearing normal.  Nose: No epistaxis.  No foreign bodies.  Mouth/Throat: Uvula is midline, oropharynx is clear and moist and mucous membranes are normal.  Eyes: Pupils are equal, round, and reactive to light.  Neck: Normal range of motion. Neck supple. No thyromegaly present.  Cardiovascular: Normal rate and regular rhythm. Exam reveals no gallop and no friction rub.  No murmur heard. Pulmonary/Chest: Effort normal and breath sounds normal. No respiratory distress. She has no wheezes. Right breast exhibits no mass, no skin change and no tenderness. Left breast exhibits no mass, no skin change and no tenderness.  Bil Axillary normal although left tender   No mass,  no skin changes  Abdominal: Soft. Bowel sounds are normal. She exhibits no distension. There is no tenderness. There is no rebound.  Musculoskeletal: Normal range of motion.  Neurological: She is alert and oriented to person, place, and time. No cranial nerve deficit.  Skin: Skin is warm and dry.    Psychiatric: She has a normal mood and affect. Judgment normal.  Vitals reviewed.  ASSESSMENT:  History of Cervical Dysplasia  Plan:  1.  I discussed the grading system of pap smears and HPV high risk viral types.   2. Follow up PAP 6 months, vs intervention if high grade dysplasia identified. 3. Treatment of persistantly abnormal PAP smears and cervical dysplasia, even mild, is discussed w pt today in detail, as well as the pros and cons of Cryo and LEEP procedures. Will consider and discuss after results. 4. No breast or axillary mass.  Monitor pain.  MMG Feb.  US/Imaging sooner if worsens w symptoms or develops mass.  Barnett Applebaum, MD, [facility], [facility] 08/11/2018  9:10 AM

## 2018-08-12 LAB — CYTOLOGY - PAP: DIAGNOSIS: NEGATIVE

## 2019-02-02 ENCOUNTER — Other Ambulatory Visit: Payer: Self-pay | Admitting: Family Medicine

## 2019-02-09 ENCOUNTER — Ambulatory Visit (INDEPENDENT_AMBULATORY_CARE_PROVIDER_SITE_OTHER): Payer: Commercial Managed Care - PPO | Admitting: Obstetrics & Gynecology

## 2019-02-09 ENCOUNTER — Encounter: Payer: Self-pay | Admitting: Obstetrics & Gynecology

## 2019-02-09 ENCOUNTER — Other Ambulatory Visit (HOSPITAL_COMMUNITY)
Admission: RE | Admit: 2019-02-09 | Discharge: 2019-02-09 | Disposition: A | Discharge status: Home / Self Care | Payer: Commercial Managed Care - PPO | Source: Ambulatory Visit | Attending: Obstetrics & Gynecology | Admitting: Obstetrics & Gynecology

## 2019-02-09 VITALS — BP 100/70 | Ht 60.0 in | Wt 118.0 lb

## 2019-02-09 DIAGNOSIS — Z1239 Encounter for other screening for malignant neoplasm of breast: Secondary | ICD-10-CM

## 2019-02-09 DIAGNOSIS — Z01419 Encounter for gynecological examination (general) (routine) without abnormal findings: Secondary | ICD-10-CM

## 2019-02-09 DIAGNOSIS — K649 Unspecified hemorrhoids: Secondary | ICD-10-CM

## 2019-02-09 DIAGNOSIS — Z13220 Encounter for screening for lipoid disorders: Secondary | ICD-10-CM

## 2019-02-09 DIAGNOSIS — Z131 Encounter for screening for diabetes mellitus: Secondary | ICD-10-CM

## 2019-02-09 DIAGNOSIS — Z1329 Encounter for screening for other suspected endocrine disorder: Secondary | ICD-10-CM

## 2019-02-09 DIAGNOSIS — N870 Mild cervical dysplasia: Secondary | ICD-10-CM

## 2019-02-09 NOTE — Patient Instructions (Signed)
PAP every 6 months Mammogram every year    Call [phone] to schedule at [facility] yearly (with PCP)

## 2019-02-09 NOTE — Progress Notes (Signed)
HPI:      [name] is a 43 y.o. [id number] who LMP was Patient's last menstrual period was 01/14/2019., she presents today for her annual examination. The patient has no complaints today. The patient is sexually active. Her last pap: approximate date 08/2018 and was normal and prior PAP 12 mos ago was ASCUS, POS HPV  with CIN I by Biopsy at colposcopy; and last mammogram: approximate date 03/03/2018 and was normal. The patient does perform self breast exams.  There is no notable family history of breast or ovarian cancer in her family.  The patient has regular exercise: yes.  The patient denies current symptoms of depression.    GYN History: Contraception: tubal ligation  PMHx: Past Medical History:  Diagnosis Date  . Arthritis   . History of chicken pox    Past Surgical History:  Procedure Laterality Date  . TUBAL LIGATION  2003   Family History  Problem Relation Age of Onset  . Diabetes Father   . Diabetes Maternal Grandfather   . Cancer Mother 72       kidney (non smoker)  . CAD Neg Hx   . Stroke Neg Hx   . Breast cancer Neg Hx    Social History   Tobacco Use  . Smoking status: Never Smoker  . Smokeless tobacco: Never Used  Substance Use Topics  . Alcohol use: Yes    Alcohol/week: 0.0 standard drinks    Comment: socially  . Drug use: No    Current Outpatient Medications:  Marland Kitchen  Multiple Vitamin (MULTIVITAMIN) tablet, Take 1 tablet by mouth daily., Disp: , Rfl:  Allergies: Patient has no known allergies.  Review of Systems  Constitutional: Negative for chills, fever and malaise/fatigue.  HENT: Negative for congestion, sinus pain and sore throat.   Eyes: Negative for blurred vision and pain.  Respiratory: Negative for cough and wheezing.   Cardiovascular: Negative for chest pain and leg swelling.  Gastrointestinal: Negative for abdominal pain, constipation, diarrhea, heartburn, nausea and vomiting.  Genitourinary: Negative for dysuria, frequency, hematuria  and urgency.  Musculoskeletal: Negative for back pain, joint pain, myalgias and neck pain.  Skin: Negative for itching and rash.  Neurological: Negative for dizziness, tremors and weakness.  Endo/Heme/Allergies: Does not bruise/bleed easily.  Psychiatric/Behavioral: Negative for depression. The patient is not nervous/anxious and does not have insomnia.     Objective: BP 100/70   Ht 5' (1.524 m)   Wt 118 lb (53.5 kg)   LMP 01/14/2019   BMI 23.05 kg/m   Filed Weights   02/09/19 0821  Weight: 118 lb (53.5 kg)   Body mass index is 23.05 kg/m. Physical Exam Constitutional:      General: She is not in acute distress.    Appearance: She is well-developed.  Genitourinary:     Pelvic exam was performed with patient supine.     Vagina, uterus and rectum normal.     No lesions in the vagina.     No vaginal bleeding.     No cervical motion tenderness, friability, lesion or polyp.     Uterus is mobile.     Uterus is not enlarged.     No uterine mass detected.    Uterus is midaxial.     No right or left adnexal mass present.     Right adnexa not tender.     Left adnexa not tender.  HENT:     Head: Normocephalic and atraumatic. No laceration.  Right Ear: Hearing normal.     Left Ear: Hearing normal.     Mouth/Throat:     Pharynx: Uvula midline.  Eyes:     Pupils: Pupils are equal, round, and reactive to light.  Neck:     Musculoskeletal: Normal range of motion and neck supple.     Thyroid: No thyromegaly.  Cardiovascular:     Rate and Rhythm: Normal rate and regular rhythm.     Heart sounds: No murmur. No friction rub. No gallop.   Pulmonary:     Effort: Pulmonary effort is normal. No respiratory distress.     Breath sounds: Normal breath sounds. No wheezing.  Chest:     Breasts:        Right: No mass, skin change or tenderness.        Left: No mass, skin change or tenderness.  Abdominal:     General: Bowel sounds are normal. There is no distension.     Palpations:  Abdomen is soft.     Tenderness: There is no abdominal tenderness. There is no rebound.  Musculoskeletal: Normal range of motion.  Neurological:     Mental Status: She is alert and oriented to person, place, and time.     Cranial Nerves: No cranial nerve deficit.  Skin:    General: Skin is warm and dry.  Psychiatric:        Judgment: Judgment normal.  Vitals signs reviewed.   Assessment:  1. Women's annual routine gynecological examination   2. CIN I (cervical intraepithelial neoplasia I)   3. Screening for breast cancer   4. Screening for diabetes mellitus   5. Screening for cholesterol level   6. Screening for thyroid disorder   7. Hemorrhoids, unspecified hemorrhoid type    Screening Plan:            1.  Cervical Screening-  Pap smear done today  2. Breast screening- Exam annually and mammogram>40 planned   3.  Labs today (fasting)  4. Counseling for contraception: bilateral tubal ligation   5. Hemorrhoids, 16 years but may be worsening, always has blood in stool; referral to GI    F/U  Return in about 6 months (around 08/12/2019) for Follow up.  Barnett Applebaum, MD, [facility], [facility] 02/09/2019  8:59 AM

## 2019-02-10 ENCOUNTER — Telehealth: Payer: Self-pay | Admitting: Obstetrics & Gynecology

## 2019-02-10 ENCOUNTER — Other Ambulatory Visit: Payer: Self-pay | Admitting: Obstetrics & Gynecology

## 2019-02-10 DIAGNOSIS — R7989 Other specified abnormal findings of blood chemistry: Secondary | ICD-10-CM

## 2019-02-10 LAB — LIPID PANEL
Chol/HDL Ratio: 3.2 ratio (ref 0.0–4.4)
Cholesterol, Total: 222 mg/dL — ABNORMAL HIGH (ref 100–199)
HDL: 69 mg/dL (ref >39)
LDL Calculated: 133 mg/dL — ABNORMAL HIGH (ref 0–99)
Triglycerides: 99 mg/dL (ref 0–149)
VLDL Cholesterol Cal: 20 mg/dL (ref 5–40)

## 2019-02-10 LAB — CYTOLOGY - PAP: Diagnosis: NEGATIVE

## 2019-02-10 LAB — TSH: TSH: 5.56 u[IU]/mL — ABNORMAL HIGH (ref 0.450–4.500)

## 2019-02-10 LAB — GLUCOSE, FASTING: GLUCOSE, PLASMA: 90 mg/dL (ref 65–99)

## 2019-02-10 NOTE — Telephone Encounter (Signed)
Should just obtain thyroid panel here first.  She can come by this week for blood draw.  More detailed than the screening test.  I will have ordered

## 2019-02-10 NOTE — Telephone Encounter (Signed)
Patient is calling stating she was recently seen for annual. Patient see has received and letter from Dr. Kenton Kingfisher about her labs and she is wanting to know what she needs to do. Please advise

## 2019-02-10 NOTE — Telephone Encounter (Signed)
Pt wants to know about her TSH results, Notes says she will get a referral to a endocrine specialist, will nancy call her for this? Who sets this up

## 2019-02-10 NOTE — Progress Notes (Signed)
Cholesterol elevated and TSH high also Rec further thyroid testing The above results of your cholesterol testing reveals higher than normal levels.  Recommendations include keeping total cholesterol below 200, LDL below 130 or even 100 when additional risk factor are present such as diabetes or heart disease, and HDL above 50.  Options include over the counter supplements or prescription therapy.  Based on your levels, I recommend we start with adding either Omega-3 (DHA or fish oil) supplement or Red Yeast Rice or Niacin as a natural alternative to reduce high cholesterol levels.  We should recheck levels next year, or sooner (no sooner than 3 months) as desired.  I reserve prescription therapy for severe or refractory cases, and always involve an internal medicine specialist in those cases.   Thyroid levels are not normal, and in need of further testing to determine the cause.  This requires additional lab testing, and sometimes referral to an endocrine specialist.  LM

## 2019-02-11 NOTE — Telephone Encounter (Signed)
Pt aware.

## 2019-02-12 ENCOUNTER — Encounter: Payer: Self-pay | Admitting: *Deleted

## 2019-02-17 ENCOUNTER — Other Ambulatory Visit: Payer: Commercial Managed Care - PPO

## 2019-02-17 ENCOUNTER — Encounter: Payer: Self-pay | Admitting: Gastroenterology

## 2019-02-17 ENCOUNTER — Ambulatory Visit (INDEPENDENT_AMBULATORY_CARE_PROVIDER_SITE_OTHER): Payer: Commercial Managed Care - PPO | Admitting: Gastroenterology

## 2019-02-17 ENCOUNTER — Other Ambulatory Visit: Payer: Self-pay

## 2019-02-17 VITALS — BP 101/66 | HR 70 | Ht 60.0 in | Wt 120.8 lb

## 2019-02-17 DIAGNOSIS — R7989 Other specified abnormal findings of blood chemistry: Secondary | ICD-10-CM

## 2019-02-17 DIAGNOSIS — K641 Second degree hemorrhoids: Secondary | ICD-10-CM

## 2019-02-17 DIAGNOSIS — K625 Hemorrhage of anus and rectum: Secondary | ICD-10-CM | POA: Diagnosis not present

## 2019-02-17 NOTE — Progress Notes (Signed)
Cephas Darby, MD [address]  [place]  [place], [place] [address]  Main: [phone]  Fax: [phone]    Gastroenterology Consultation  Referring Provider:     Gae Dry, MD Primary Care Physician:  Ria Bush, MD Primary Gastroenterologist:  Dr. Cephas Darby Reason for Consultation:     Symptomatic hemorrhoids        HPI:   Olivia Estrada is a 43 y.o. female referred by Dr. Ria Bush, MD  for consultation & management of chronic intermittent symptomatic hemorrhoids.  Her symptoms include occasional episodes of sharp rectal pain that lasts for few hours and more frequent episodes of rectal discomfort.  Occasional bright red blood on wiping only.  She has been trying over-the-counter hemorrhoidal creams as needed which provides temporary relief.  She is bothered by more frequent symptoms of rectal discomfort and sometimes prolapse of the hemorrhoids that reduce spontaneously.  She denies weight loss.  She gained about few pounds and TSH is elevated, undergoing work-up for hypothyroidism.  She reports taking iron supplements which makes her stools look dark gray but formed.  She denies diarrhea or constipation, change in stool caliber, spending significant time on toilet, rectal discharge, abdominal pain.  She was told that her iron levels were low and she was anemic in the past.  I do not have recent CBC on her.  She does not drink or smoke.  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 Arthritis   . History of chicken pox     Past Surgical History:  Procedure Laterality Date  . TUBAL LIGATION  2003    Current Outpatient Medications:  Marland Kitchen  Multiple Vitamin (MULTIVITAMIN) tablet, Take 1 tablet by mouth daily., Disp: , Rfl:    Family History  Problem Relation Age of Onset  .  Diabetes Father   . Diabetes Maternal Grandfather   . Cancer Mother 12       kidney (non smoker)  . CAD Neg Hx   . Stroke Neg Hx   . Breast cancer Neg Hx      Social History   Tobacco Use  . Smoking status: Never Smoker  . Smokeless tobacco: Never Used  Substance Use Topics  . Alcohol use: Yes    Alcohol/week: 0.0 standard drinks    Comment: socially  . Drug use: No    Allergies as of 02/17/2019  . (No Known Allergies)    Review of Systems:    All systems reviewed and negative except where noted in HPI.   Physical Exam:  BP 101/66   Pulse 70   Ht 5' (1.524 m)   Wt 120 lb 12.8 oz (54.8 kg)   BMI 23.59 kg/m  No LMP recorded.  General:   Alert,  Well-developed, well-nourished, pleasant and cooperative in NAD Head:  Normocephalic and atraumatic. Eyes:  Sclera clear, no icterus.   Conjunctiva pink. Ears:  Normal auditory acuity. Nose:  No deformity, discharge, or lesions. Mouth:  No deformity or lesions,oropharynx pink & moist. Neck:  Supple; no masses or thyromegaly. Lungs:  Respirations even and unlabored.  Clear throughout to auscultation.   No wheezes, crackles, or rhonchi. No acute distress. Heart:  Regular rate and rhythm; no murmurs, clicks, rubs, or gallops. Abdomen:  Normal bowel sounds. Soft, non-tender and non-distended without masses, hepatosplenomegaly or hernias noted.  No guarding or rebound tenderness.   Rectal: Normal perianal skin, no skin tags, palpable external hemorrhoids Msk:  Symmetrical without gross deformities. Good, equal movement & strength bilaterally. Pulses:  Normal pulses noted. Extremities:  No clubbing or edema.  No cyanosis. Neurologic:  Alert and oriented x3;  grossly normal neurologically. Skin:  Intact without significant lesions or rashes. No jaundice. Lymph Nodes:  No significant cervical adenopathy. Psych:  Alert and cooperative. Normal mood and affect.  Imaging Studies: No abdominal imaging  Assessment and Plan:    Olivia Estrada is a 43 y.o. female with no significant past medical history, elevated TSH presents with chronic symptoms of rectal discomfort, prolapse of the hemorrhoidal tissue that spontaneously reduce, intermittent bright red blood per rectum on wiping only, occasional episodes of sharp anal pain which is self-limited.  Her symptoms are highly consistent with symptomatic external hemorrhoids  Discussed with her about outpatient hemorrhoid ligation and she is agreeable Risks and benefits discussed, consent obtained Perform hemorrhoid ligation today   History of anemia Check CBC, ferritin today If confirmed iron deficiency, recommend upper endoscopy and colonoscopy   Follow up in 2 to 3 months or sooner for hemorrhoid ligation   Cephas Darby, MD

## 2019-02-17 NOTE — Progress Notes (Signed)

## 2019-02-18 LAB — THYROID PANEL WITH TSH
Free Thyroxine Index: 1.6 (ref 1.2–4.9)
T3 Uptake Ratio: 26 % (ref 24–39)
T4, Total: 6.2 ug/dL (ref 4.5–12.0)
TSH: 4.49 u[IU]/mL (ref 0.450–4.500)

## 2019-02-18 LAB — CBC
HEMATOCRIT: 39.7 % (ref 34.0–46.6)
HEMOGLOBIN: 13.4 g/dL (ref 11.1–15.9)
MCH: 30.3 pg (ref 26.6–33.0)
MCHC: 33.8 g/dL (ref 31.5–35.7)
MCV: 90 fL (ref 79–97)
Platelets: 241 10*3/uL (ref 150–450)
RBC: 4.42 x10E6/uL (ref 3.77–5.28)
RDW: 12.2 % (ref 11.7–15.4)
WBC: 5 10*3/uL (ref 3.4–10.8)

## 2019-02-18 LAB — FERRITIN: Ferritin: 72 ng/mL (ref 15–150)

## 2019-02-18 NOTE — Progress Notes (Signed)
Left message to make pt aware of results

## 2019-02-18 NOTE — Progress Notes (Signed)
Thyroid normal on follow up testing, let her know

## 2019-02-19 ENCOUNTER — Telehealth: Payer: Self-pay

## 2019-02-19 NOTE — Telephone Encounter (Signed)
Pt is confused about pap not having HPV tested every six months.  States PH told her he wanted to test it q45m.  Wants to know if she still have HPV or not.  [phone]

## 2019-02-19 NOTE — Telephone Encounter (Signed)
Do not need HPV checked every 6 mos, but do need PAP done every 6 mos until a series of normals develops

## 2019-02-19 NOTE — Telephone Encounter (Signed)
Pt aware.

## 2019-03-23 ENCOUNTER — Ambulatory Visit: Payer: Commercial Managed Care - PPO | Admitting: Gastroenterology

## 2019-04-30 ENCOUNTER — Other Ambulatory Visit: Payer: Self-pay | Admitting: Obstetrics & Gynecology

## 2019-04-30 DIAGNOSIS — Z1231 Encounter for screening mammogram for malignant neoplasm of breast: Secondary | ICD-10-CM

## 2019-05-15 ENCOUNTER — Ambulatory Visit: Payer: Commercial Managed Care - PPO | Admitting: Gastroenterology

## 2019-06-16 ENCOUNTER — Ambulatory Visit
Admission: RE | Admit: 2019-06-16 | Discharge: 2019-06-16 | Disposition: A | Discharge status: Home / Self Care | Payer: Commercial Managed Care - PPO | Source: Ambulatory Visit | Attending: Obstetrics & Gynecology | Admitting: Obstetrics & Gynecology

## 2019-06-16 ENCOUNTER — Other Ambulatory Visit: Payer: Self-pay | Admitting: Obstetrics & Gynecology

## 2019-06-16 ENCOUNTER — Other Ambulatory Visit: Payer: Self-pay

## 2019-06-16 DIAGNOSIS — Z1231 Encounter for screening mammogram for malignant neoplasm of breast: Secondary | ICD-10-CM | POA: Insufficient documentation

## 2019-06-16 DIAGNOSIS — N6320 Unspecified lump in the left breast, unspecified quadrant: Secondary | ICD-10-CM

## 2019-06-17 ENCOUNTER — Telehealth: Payer: Self-pay | Admitting: Family Medicine

## 2019-06-17 MED ORDER — PREDNISONE 20 MG PO TABS: ORAL_TABLET | ORAL | 0 refills | Status: DC

## 2019-06-17 NOTE — Telephone Encounter (Signed)
Spoke with pt relaying Dr. Synthia Innocent message.  Pt verbalizes understanding and confirms rx sent to correct pharmacy.

## 2019-06-17 NOTE — Telephone Encounter (Signed)
Best number [phone]  Pt called wanting to get a rx for  prednisone for pain in thumb on right hand  [facility] [address] [place]  Pt stated she was told she could call to get a refill

## 2019-06-17 NOTE — Telephone Encounter (Signed)
Prednisone course sent to pharmacy. Let us know if ongoing pain for hand doctor referral.  [facility]. [facility]'t [facility] [address] in [place]

## 2019-07-10 ENCOUNTER — Ambulatory Visit: Payer: Commercial Managed Care - PPO | Admitting: Gastroenterology

## 2019-07-13 ENCOUNTER — Encounter: Payer: Self-pay | Admitting: Gastroenterology

## 2019-07-13 ENCOUNTER — Other Ambulatory Visit: Payer: Self-pay

## 2019-07-13 ENCOUNTER — Ambulatory Visit: Payer: Commercial Managed Care - PPO | Admitting: Gastroenterology

## 2019-07-13 VITALS — BP 98/63 | HR 75 | Temp 97.7°F | Resp 17 | Ht 60.0 in | Wt 120.4 lb

## 2019-07-13 DIAGNOSIS — K640 First degree hemorrhoids: Secondary | ICD-10-CM | POA: Diagnosis not present

## 2019-07-13 NOTE — Progress Notes (Signed)
Olivia Darby, MD [address]  [place]  [place], [place] [address]  Main: [phone]  Fax: [phone]    Gastroenterology Consultation  Referring Provider:     Ria Bush, MD Primary Care Physician:  Olivia Bush, MD Primary Gastroenterologist:  Dr. Cephas Estrada Reason for Consultation:     Symptomatic hemorrhoids        HPI:   Olivia Estrada is a 43 y.o. female referred by Dr. Ria Bush, MD  for consultation & management of chronic intermittent symptomatic hemorrhoids.  Her symptoms include occasional episodes of sharp rectal pain that lasts for few hours and more frequent episodes of rectal discomfort.  Occasional bright red blood on wiping only.  She has been trying over-the-counter hemorrhoidal creams as needed which provides temporary relief.  She is bothered by more frequent symptoms of rectal discomfort and sometimes prolapse of the hemorrhoids that reduce spontaneously.  She denies weight loss.  She gained about few pounds and TSH is elevated, undergoing work-up for hypothyroidism.  She reports taking iron supplements which makes her stools look dark gray but formed.  She denies diarrhea or constipation, change in stool caliber, spending significant time on toilet, rectal discharge, abdominal pain.  She was told that her iron levels were low and she was anemic in the past.  I do not have recent CBC on her.  She does not drink or smoke.  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  She does not have any other hemorrhoid symptoms but asking for an anoscopy as I did not do it during last visit  NSAIDs: None  Antiplts/Anticoagulants/Anti thrombotics: None  GI Procedures: None She denies family history of GI malignancy  Past Medical History:  Diagnosis Date  . Arthritis   . History of chicken pox     Past Surgical History:  Procedure  Laterality Date  . TUBAL LIGATION  2003    Current Outpatient Medications:  Marland Kitchen  Multiple Vitamin (MULTIVITAMIN) tablet, Take 1 tablet by mouth daily., Disp: , Rfl:  .  predniSONE (DELTASONE) 20 MG tablet, Take two tablets daily for 3 days followed by one tablet daily for 4 days (Patient not taking: Reported on 07/13/2019), Disp: 10 tablet, Rfl: 0   Family History  Problem Relation Age of Onset  . Diabetes Father   . Diabetes Maternal Grandfather   . Cancer Mother 30       kidney (non smoker)  . CAD Neg Hx   . Stroke Neg Hx   . Breast cancer Neg Hx      Social History   Tobacco Use  . Smoking status: Never Smoker  . Smokeless tobacco: Never Used  Substance Use Topics  . Alcohol use: Yes    Alcohol/week: 0.0 standard drinks    Comment: socially  . Drug use: No    Allergies as of 07/13/2019  . (No Known Allergies)    Review of Systems:    All systems reviewed and negative except where noted in HPI.   Physical Exam:  BP 98/63 (BP Location: Left Arm, Patient Position: Sitting, Cuff Size: Normal)   Pulse 75   Temp 97.7 F (36.5 C)   Resp 17   Ht 5' (1.524 m)   Wt 120 lb 6.4 oz (54.6 kg)   BMI 23.51 kg/m  No LMP recorded.  General:   Alert,  Well-developed, well-nourished, pleasant and cooperative in  NAD Head:  Normocephalic and atraumatic. Eyes:  Sclera clear, no icterus.   Conjunctiva pink. Ears:  Normal auditory acuity. Nose:  No deformity, discharge, or lesions. Mouth:  No deformity or lesions,oropharynx pink & moist. Neck:  Supple; no masses or thyromegaly. Lungs:  Respirations even and unlabored.  Clear throughout to auscultation.   No wheezes, crackles, or rhonchi. No acute distress. Heart:  Regular rate and rhythm; no murmurs, clicks, rubs, or gallops. Abdomen:  Normal bowel sounds. Soft, non-tender and non-distended without masses, hepatosplenomegaly or hernias noted.  No guarding or rebound tenderness.   Rectal: Normal perianal skin, no skin tags,  palpable external hemorrhoids, nontender Anoscopy revealed normal mucosa in the distal rectum, no evidence of fissure or other lesions Msk:  Symmetrical without gross deformities. Good, equal movement & strength bilaterally. Pulses:  Normal pulses noted. Extremities:  No clubbing or edema.  No cyanosis. Neurologic:  Alert and oriented x3;  grossly normal neurologically. Skin:  Intact without significant lesions or rashes. No jaundice. Psych:  Alert and cooperative. Normal mood and affect.  Imaging Studies: No abdominal imaging  Assessment and Plan:   Olivia Estrada is a 43 y.o. female with no significant past medical history, elevated TSH seen for follow-up of symptomatic external hemorrhoids.  Patient underwent ligation of right posterior hemorrhoid.  Currently, she is asymptomatic other than intermittent rectal bleeding.  She does not want hemorrhoid ligation today.  Anoscopy was normal.  I discussed with her about colonoscopy and she wanted to know if it will be preventive, I told her that it will be a diagnostic colonoscopy for rectal bleeding.  She said she will check with her insurance company about the coverage and call my office to schedule the procedure  History of anemia CBC, ferritin normal  Follow up as needed   Olivia Darby, MD

## 2019-08-04 ENCOUNTER — Other Ambulatory Visit: Payer: Commercial Managed Care - PPO

## 2019-09-29 ENCOUNTER — Ambulatory Visit
Admission: RE | Admit: 2019-09-29 | Discharge: 2019-09-29 | Disposition: A | Discharge status: Home / Self Care | Payer: Commercial Managed Care - PPO | Source: Ambulatory Visit | Attending: Obstetrics & Gynecology | Admitting: Obstetrics & Gynecology

## 2019-09-29 DIAGNOSIS — N6320 Unspecified lump in the left breast, unspecified quadrant: Secondary | ICD-10-CM

## 2019-10-06 ENCOUNTER — Other Ambulatory Visit (HOSPITAL_COMMUNITY)
Admission: RE | Admit: 2019-10-06 | Discharge: 2019-10-06 | Disposition: A | Discharge status: Home / Self Care | Payer: Commercial Managed Care - PPO | Source: Ambulatory Visit | Attending: Obstetrics and Gynecology | Admitting: Obstetrics and Gynecology

## 2019-10-06 ENCOUNTER — Ambulatory Visit (INDEPENDENT_AMBULATORY_CARE_PROVIDER_SITE_OTHER): Payer: Commercial Managed Care - PPO | Admitting: Obstetrics and Gynecology

## 2019-10-06 ENCOUNTER — Encounter: Payer: Self-pay | Admitting: Obstetrics and Gynecology

## 2019-10-06 ENCOUNTER — Other Ambulatory Visit: Payer: Self-pay

## 2019-10-06 VITALS — BP 90/56 | HR 76 | Ht 60.0 in | Wt 121.5 lb

## 2019-10-06 DIAGNOSIS — R6889 Other general symptoms and signs: Secondary | ICD-10-CM

## 2019-10-06 DIAGNOSIS — R8789 Other abnormal findings in specimens from female genital organs: Secondary | ICD-10-CM | POA: Insufficient documentation

## 2019-10-06 DIAGNOSIS — Z1329 Encounter for screening for other suspected endocrine disorder: Secondary | ICD-10-CM

## 2019-10-06 DIAGNOSIS — R5383 Other fatigue: Secondary | ICD-10-CM | POA: Diagnosis not present

## 2019-10-06 DIAGNOSIS — R87618 Other abnormal cytological findings on specimens from cervix uteri: Secondary | ICD-10-CM

## 2019-10-06 NOTE — Progress Notes (Signed)
  Subjective:     Patient ID: Olivia Estrada, female   DOB: 26-Dec-1975, 43 y.o.   MRN: [id number]  HPI Here for repeat pap per recommendations from previous provider. Desires clarification of HPV and paps as she is slightly confused over screening and results.  Also desires thyroid labs repeated as previous results showed elevated TSH. She does report feeling tired all the time and cold intolerance. Also notes skin is itching more and states she understands these may be symptoms of thyroid disorder. Denies any family h/o thyroid issues and none personally in the past.   Review of Systems  Constitutional: Positive for fatigue.  Endocrine: Positive for cold intolerance.  All other systems reviewed and are negative.      Objective:   Physical Exam A&Ox4 Well groomed female in no distress Blood pressure (!) 90/56, pulse 76, height 5' (1.524 m), weight 121 lb 8 oz (55.1 kg), last menstrual period 09/17/2019. Pelvic exam: normal external genitalia, vulva, vagina, cervix, uterus and adnexa, PAP: Pap smear done today.    Assessment:     H/o abnormal pap, HPV+ H/o elevated TSH  fatigue Cold intolerance     Plan:     Pap repeated. Discussed HPV and it's effects on the cervix in detail and reviewed ASCCP guidelines for follow up. Patient states clear understanding. Recommended returning to yearly pap with co-testing every 3 years if todays pap is negative.  Thyroid panel obtained and will follow up accordingly.  RTC as needed.   Ladine Kiper,CNM

## 2019-10-06 NOTE — Progress Notes (Signed)
Patient here for repeat pap smear.  Patient request TSH lab be drawn today.

## 2019-10-07 LAB — THYROID PANEL WITH TSH
Free Thyroxine Index: 1.6 (ref 1.2–4.9)
T3 Uptake Ratio: 26 % (ref 24–39)
T4, Total: 6.2 ug/dL (ref 4.5–12.0)
TSH: 4.36 u[IU]/mL (ref 0.450–4.500)

## 2019-10-14 LAB — CYTOLOGY - PAP
Comment: NEGATIVE
Diagnosis: NEGATIVE
High risk HPV: NEGATIVE

## 2020-01-07 ENCOUNTER — Telehealth: Payer: Self-pay | Admitting: Gastroenterology

## 2020-01-07 NOTE — Telephone Encounter (Signed)
Patient called & would like to schedule a colonoscopy.

## 2020-01-08 NOTE — Telephone Encounter (Signed)
LVM asking pt to return call to schedule colonoscopy

## 2020-01-12 ENCOUNTER — Telehealth: Payer: Self-pay | Admitting: Gastroenterology

## 2020-01-12 DIAGNOSIS — K625 Hemorrhage of anus and rectum: Secondary | ICD-10-CM

## 2020-01-12 NOTE — Telephone Encounter (Signed)
Pt is calling  To schedule a colonoscopy she states she had left messages but has not heard anything please call

## 2020-01-13 NOTE — Telephone Encounter (Signed)
Pt is calling again to schedule a colonoscopy she has been trying since last week and has not received a call back, I did let her know Dr. Verlin Grills nurse is out of the office until Thursday please call pt

## 2020-01-14 ENCOUNTER — Other Ambulatory Visit: Payer: Self-pay

## 2020-01-14 DIAGNOSIS — K625 Hemorrhage of anus and rectum: Secondary | ICD-10-CM

## 2020-01-14 NOTE — Telephone Encounter (Signed)
Returned call to schedule patient's colonoscopy.  Apologized for the delayed return call.  She has been scheduled for her colonoscopy with Dr. Marius Ditch on Thursday 01/28/20 at [facility].  Diagnosis: Rectal Bleed. Colonoscopy instructions have been reviewed.  Patient has been advised of her COVID test date Tuesday 01/25/20 at [facility].  Instructions will be mailed.  Thanks,  [place], [place]

## 2020-01-26 ENCOUNTER — Other Ambulatory Visit
Admission: RE | Admit: 2020-01-26 | Discharge: 2020-01-26 | Disposition: A | Discharge status: Home / Self Care | Payer: Commercial Managed Care - PPO | Source: Ambulatory Visit | Attending: Gastroenterology | Admitting: Gastroenterology

## 2020-01-26 ENCOUNTER — Other Ambulatory Visit: Payer: Self-pay

## 2020-01-26 DIAGNOSIS — Z20822 Contact with and (suspected) exposure to covid-19: Secondary | ICD-10-CM | POA: Insufficient documentation

## 2020-01-26 DIAGNOSIS — Z01812 Encounter for preprocedural laboratory examination: Secondary | ICD-10-CM | POA: Insufficient documentation

## 2020-01-26 LAB — SARS CORONAVIRUS 2 (TAT 6-24 HRS): SARS Coronavirus 2: NEGATIVE

## 2020-01-28 ENCOUNTER — Ambulatory Visit: Payer: Commercial Managed Care - PPO

## 2020-01-28 ENCOUNTER — Encounter: Payer: Self-pay | Admitting: Gastroenterology

## 2020-01-28 ENCOUNTER — Other Ambulatory Visit: Payer: Self-pay

## 2020-01-28 ENCOUNTER — Telehealth: Payer: Self-pay

## 2020-01-28 ENCOUNTER — Ambulatory Visit
Admission: RE | Admit: 2020-01-28 | Discharge: 2020-01-28 | Disposition: A | Discharge status: Home / Self Care | Payer: Commercial Managed Care - PPO | Attending: Gastroenterology | Admitting: Gastroenterology

## 2020-01-28 ENCOUNTER — Encounter
Admission: RE | Disposition: A | Discharge status: Home / Self Care | Payer: Self-pay | Source: Home / Self Care | Attending: Gastroenterology

## 2020-01-28 DIAGNOSIS — D122 Benign neoplasm of ascending colon: Secondary | ICD-10-CM | POA: Diagnosis not present

## 2020-01-28 DIAGNOSIS — Z791 Long term (current) use of non-steroidal anti-inflammatories (NSAID): Secondary | ICD-10-CM | POA: Diagnosis not present

## 2020-01-28 DIAGNOSIS — K635 Polyp of colon: Secondary | ICD-10-CM

## 2020-01-28 DIAGNOSIS — Z79899 Other long term (current) drug therapy: Secondary | ICD-10-CM | POA: Diagnosis not present

## 2020-01-28 DIAGNOSIS — D120 Benign neoplasm of cecum: Secondary | ICD-10-CM | POA: Diagnosis not present

## 2020-01-28 DIAGNOSIS — K625 Hemorrhage of anus and rectum: Secondary | ICD-10-CM | POA: Diagnosis not present

## 2020-01-28 DIAGNOSIS — K644 Residual hemorrhoidal skin tags: Secondary | ICD-10-CM | POA: Diagnosis not present

## 2020-01-28 DIAGNOSIS — M1990 Unspecified osteoarthritis, unspecified site: Secondary | ICD-10-CM | POA: Insufficient documentation

## 2020-01-28 HISTORY — PX: COLONOSCOPY WITH PROPOFOL: SHX5780

## 2020-01-28 LAB — POCT PREGNANCY, URINE: Preg Test, Ur: NEGATIVE

## 2020-01-28 SURGERY — COLONOSCOPY WITH PROPOFOL
Anesthesia: General

## 2020-01-28 MED ORDER — PROPOFOL 10 MG/ML IV BOLUS
INTRAVENOUS | Status: AC
  Filled 2020-01-28: qty 20

## 2020-01-28 MED ORDER — PROPOFOL 10 MG/ML IV BOLUS
INTRAVENOUS | Status: DC | PRN
  Administered 2020-01-28: 60 mg via INTRAVENOUS

## 2020-01-28 MED ORDER — LIDOCAINE HCL (CARDIAC) PF 100 MG/5ML IV SOSY
PREFILLED_SYRINGE | INTRAVENOUS | Status: DC | PRN
  Administered 2020-01-28: 50 mg via INTRAVENOUS

## 2020-01-28 MED ORDER — PHENYLEPHRINE HCL (PRESSORS) 10 MG/ML IV SOLN
INTRAVENOUS | Status: DC | PRN
  Administered 2020-01-28: 50 ug via INTRAVENOUS
  Administered 2020-01-28: 100 ug via INTRAVENOUS
  Administered 2020-01-28: 50 ug via INTRAVENOUS
  Administered 2020-01-28: 100 ug via INTRAVENOUS

## 2020-01-28 MED ORDER — PROPOFOL 500 MG/50ML IV EMUL
INTRAVENOUS | Status: DC | PRN
  Administered 2020-01-28: 150 ug/kg/min via INTRAVENOUS

## 2020-01-28 MED ORDER — SODIUM CHLORIDE 0.9 % IV SOLN: INTRAVENOUS | Status: DC

## 2020-01-28 MED ORDER — SODIUM CHLORIDE (PF) 0.9 % IJ SOLN
INTRAMUSCULAR | Status: DC | PRN
  Administered 2020-01-28: 2 mL

## 2020-01-28 NOTE — Op Note (Signed)
[facility] Gastroenterology Patient Name: Olivia Estrada Procedure Date: 01/28/2020 9:30 AM MRN: [id number] Account #: [id number] Date of Birth: [birth year] Admit Type: Outpatient Age: 44 Room: [facility] ENDO ROOM 1 Gender: Female Note Status: Finalized Procedure:             Colonoscopy Indications:           This is the patient's first colonoscopy, Rectal                         bleeding Providers:             Lin Landsman MD, MD Referring MD:          Ria Bush (Referring MD) Medicines:             Monitored Anesthesia Care Complications:         No immediate complications. Estimated blood loss:                         Minimal. Procedure:             Pre-Anesthesia Assessment:                        - Prior to the procedure, a History and Physical was                         performed, and patient medications and allergies were                         reviewed. The patient is competent. The risks and                         benefits of the procedure and the sedation options and                         risks were discussed with the patient. All questions                         were answered and informed consent was obtained.                         Patient identification and proposed procedure were                         verified by the physician, the nurse, the                         anesthesiologist, the anesthetist and the technician                         in the pre-procedure area in the procedure room in the                         endoscopy suite. Mental Status Examination: alert and                         oriented. Airway Examination: normal oropharyngeal  airway and neck mobility. Respiratory Examination:                         clear to auscultation. CV Examination: normal.                         Prophylactic Antibiotics: The patient does not require                         prophylactic antibiotics. Prior  Anticoagulants: The                         patient has taken no previous anticoagulant or                         antiplatelet agents. ASA Grade Assessment: I - A                         normal, healthy patient. After reviewing the risks and                         benefits, the patient was deemed in satisfactory                         condition to undergo the procedure. The anesthesia                         plan was to use monitored anesthesia care (MAC).                         Immediately prior to administration of medications,                         the patient was re-assessed for adequacy to receive                         sedatives. The heart rate, respiratory rate, oxygen                         saturations, blood pressure, adequacy of pulmonary                         ventilation, and response to care were monitored                         throughout the procedure. The physical status of the                         patient was re-assessed after the procedure.                        After obtaining informed consent, the colonoscope was                         passed under direct vision. Throughout the procedure,                         the patient's blood pressure, pulse, and oxygen  saturations were monitored continuously. The                         Colonoscope was introduced through the anus and                         advanced to the the cecum, identified by appendiceal                         orifice and ileocecal valve. The colonoscopy was                         performed with difficulty due to a tortuous colon.                         Successful completion of the procedure was aided by                         applying abdominal pressure. The patient tolerated the                         procedure well. The quality of the bowel preparation                         was evaluated using the BBPS [facility] Bowel Preparation                         Scale)  with scores of: Right Colon = 3, Transverse                         Colon = 3 and Left Colon = 3 (entire mucosa seen well                         with no residual staining, small fragments of stool or                         opaque liquid). The total BBPS score equals 9. Findings:      The perianal and digital rectal examinations were normal. Pertinent       negatives include normal sphincter tone and no palpable rectal lesions.      A 8 mm polyp was found in the cecum. The polyp was sessile. The polyp       was removed with a cold snare. Resection and retrieval were complete.      A 10 mm polyp was found in the ascending colon. The polyp was flat.       Preparations were made for mucosal resection. Saline was injected to       raise the lesion. Snare mucosal resection was performed. Resection and       retrieval were complete.      Non-bleeding external hemorrhoids were found during retroflexion. The       hemorrhoids were small. Impression:            - One 8 mm polyp in the cecum, removed with a cold                         snare. Resected and retrieved.                        -  One 10 mm polyp in the ascending colon, removed with                         mucosal resection. Resected and retrieved.                        - Non-bleeding external hemorrhoids, source of rectal                         bleeding.                        - Mucosal resection was performed. Resection and                         retrieval were complete. Recommendation:        - Discharge patient to home (with escort).                        - Resume previous diet today.                        - Continue present medications.                        - Await pathology results.                        - Repeat colonoscopy in 5 years for surveillance and                         for surveillance based on pathology results.                        - Return to my office as previously scheduled. Procedure Code(s):     ---  Professional ---                        [phone], Colonoscopy, flexible; with endoscopic mucosal                         resection                        45385, 70, Colonoscopy, flexible; with removal of                         tumor(s), polyp(s), or other lesion(s) by snare                         technique Diagnosis Code(s):     --- Professional ---                        K63.5, Polyp of colon                        K64.4, Residual hemorrhoidal skin tags                        K62.5, Hemorrhage of anus and rectum CPT copyright 2019 American Medical Association. All rights reserved. The codes documented in this report are  preliminary and upon coder review may  be revised to meet current compliance requirements. Dr. Ulyess Mort Lin Landsman MD, MD 01/28/2020 10:06:37 AM This report has been signed electronically. Number of Addenda: 0 Note Initiated On: 01/28/2020 9:30 AM Scope Withdrawal Time: 0 hours 21 minutes 55 seconds  Total Procedure Duration: 0 hours 26 minutes 0 seconds  Estimated Blood Loss:  Estimated blood loss: none. Estimated blood loss was                         minimal.      [facility]

## 2020-01-28 NOTE — Telephone Encounter (Signed)
Per Dr. Marius Ditch Please send in a prescription for 0.125% nitroglycerin with 5% lidocaine to [facility] in [place]. Patient will come to office now to pick up the instructions for application of nitroglycerin, thanks. Diagnosis: anal fissure  Faxed prescription to [facility] and put instructions up front for patient to pick up

## 2020-01-28 NOTE — H&P (Signed)
Cephas Darby, MD [address]  [place], [place] [address]  Main: [phone]  Fax: [phone] Pager: [phone]  Primary Care Physician:  Ria Bush, MD Primary Gastroenterologist:  Dr. Cephas Darby  Pre-Procedure History & Physical: HPI:  Olivia Estrada is a 44 y.o. female is here for an colonoscopy.   Past Medical History:  Diagnosis Date  . Arthritis   . History of chicken pox     Past Surgical History:  Procedure Laterality Date  . TUBAL LIGATION  2003    Prior to Admission medications   Medication Sig Start Date End Date Taking? Authorizing Provider  cyclobenzaprine (FLEXERIL) 10 MG tablet Take 10 mg by mouth at bedtime. 09/15/19   [provider]  meloxicam (MOBIC) 15 MG tablet Take 15 mg by mouth daily. 09/15/19   [provider]  Multiple Vitamin (MULTIVITAMIN) tablet Take 1 tablet by mouth daily.    [provider]    Allergies as of 01/14/2020  . (No Known Allergies)    Family History  Problem Relation Age of Onset  . Diabetes Father   . Diabetes Maternal Grandfather   . Cancer Mother 5       kidney (non smoker)  . CAD Neg Hx   . Stroke Neg Hx   . Breast cancer Neg Hx   . Ovarian cancer Neg Hx   . Colon cancer Neg Hx     Social History   Socioeconomic History  . Marital status: Married    Spouse name: Not on file  . Number of children: Not on file  . Years of education: Not on file  . Highest education level: Not on file  Occupational History  . Not on file  Tobacco Use  . Smoking status: Never Smoker  . Smokeless tobacco: Never Used  Substance and Sexual Activity  . Alcohol use: Yes    Comment: socially. none last 24hrs  . Drug use: No  . Sexual activity: Yes    Birth control/protection: Surgical    Comment: Tubal ligation  Other Topics Concern  . 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 Difficulty of Paying Living Expenses: Not on file  Food Insecurity:   . Worried About Charity fundraiser in the Last Year: Not on file  . Ran Out of Food in the Last Year: Not on file  Transportation Needs:   . Lack of Transportation (Medical): Not on file  . Lack of Transportation (Non-Medical): Not on file  Physical Activity:   . Days of Exercise per Week: Not on file  . Minutes of Exercise per Session: Not on file  Stress:   . Feeling of Stress : Not on file  Social Connections:   . Frequency of Communication with Friends and Family: Not on file  . Frequency of Social Gatherings with Friends and Family: Not on file  . Attends Religious Services: Not on file  . Active Member of Clubs or Organizations: Not on file  . Attends Archivist Meetings: Not on file  . Marital Status: Not on file  Intimate Partner Violence:   . Fear of Current or Ex-Partner:  Not on file  . Emotionally Abused: Not on file  . Physically Abused: Not on file  . Sexually Abused: Not on file    Review of Systems: See HPI, otherwise negative ROS  Physical Exam: BP 109/79   Pulse 69   Temp (!) 97.2 F (36.2 C) (Temporal)   Resp 14   Ht 5' (1.524 m)   Wt 54.4 kg   LMP 01/28/2020   SpO2 100%   BMI 23.44 kg/m  General:   Alert,  pleasant and cooperative in NAD Head:  Normocephalic and atraumatic. Neck:  Supple; no masses or thyromegaly. Lungs:  Clear throughout to auscultation.    Heart:  Regular rate and rhythm. Abdomen:  Soft, nontender and nondistended. Normal bowel sounds, without guarding, and without rebound.   Neurologic:  Alert and  oriented x4;  grossly normal neurologically.  Impression/Plan: Olivia Estrada is here for an colonoscopy to be performed for rectal bleeding  Risks,  benefits, limitations, and alternatives regarding  colonoscopy have been reviewed with the patient.  Questions have been answered.  All parties agreeable.   Sherri Sear, MD  01/28/2020, 9:23 AM

## 2020-01-28 NOTE — Anesthesia Postprocedure Evaluation (Signed)
Anesthesia Post Note  Patient: Olivia Estrada  Procedure(s) Performed: COLONOSCOPY WITH PROPOFOL (N/A )  Patient location during evaluation: PACU Anesthesia Type: General Level of consciousness: awake and alert Pain management: pain level controlled Vital Signs Assessment: post-procedure vital signs reviewed and stable Respiratory status: spontaneous breathing, nonlabored ventilation and respiratory function stable Cardiovascular status: blood pressure returned to baseline and stable Postop Assessment: no apparent nausea or vomiting Anesthetic complications: no     Last Vitals:  Vitals:   01/28/20 1000 01/28/20 1010  BP: 105/68 119/72  Pulse: 80 78  Resp: 17 16  Temp: (!) 36.2 C   SpO2: 100% 100%    Last Pain:  Vitals:   01/28/20 1000  TempSrc: Temporal  PainSc:                  Olivia Estrada

## 2020-01-28 NOTE — Anesthesia Preprocedure Evaluation (Signed)
Anesthesia Evaluation  Patient identified by MRN, date of birth, ID band Patient awake    Reviewed: Allergy & Precautions, H&P , NPO status , Patient's Chart, lab work & pertinent test results  Airway Mallampati: II  TM Distance: >3 FB Neck ROM: full    Dental  (+) Teeth Intact   Pulmonary neg pulmonary ROS, neg shortness of breath, neg recent URI, Not current smoker,           Cardiovascular (-) hypertension(-) anginanegative cardio ROS       Neuro/Psych negative neurological ROS  negative psych ROS   GI/Hepatic negative GI ROS, Neg liver ROS,   Endo/Other  negative endocrine ROS  Renal/GU negative Renal ROS  negative genitourinary   Musculoskeletal   Abdominal   Peds  Hematology negative hematology ROS (+)   Anesthesia Other Findings Past Medical History: No date: Arthritis No date: History of chicken pox  Past Surgical History: 2003: TUBAL LIGATION  BMI    Body Mass Index: 23.44 kg/m      Reproductive/Obstetrics negative OB ROS                            Anesthesia Physical Anesthesia Plan  ASA: I  Anesthesia Plan: General   Post-op Pain Management:    Induction:   PONV Risk Score and Plan: Propofol infusion and TIVA  Airway Management Planned: Natural Airway and Nasal Cannula  Additional Equipment:   Intra-op Plan:   Post-operative Plan:   Informed Consent: I have reviewed the patients History and Physical, chart, labs and discussed the procedure including the risks, benefits and alternatives for the proposed anesthesia with the patient or authorized representative who has indicated his/her understanding and acceptance.     Dental Advisory Given  Plan Discussed with: Anesthesiologist  Anesthesia Plan Comments:        Anesthesia Quick Evaluation

## 2020-01-28 NOTE — Transfer of Care (Signed)
Immediate Anesthesia Transfer of Care Note  Patient: Olivia Estrada  Procedure(s) Performed: COLONOSCOPY WITH PROPOFOL (N/A )  Patient Location: PACU  Anesthesia Type:General  Level of Consciousness: drowsy  Airway & Oxygen Therapy: Patient Spontanous Breathing and Patient connected to nasal cannula oxygen  Post-op Assessment: Report given to RN and Post -op Vital signs reviewed and stable  Post vital signs: Reviewed and stable  Last Vitals:  Vitals Value Taken Time  BP    Temp    Pulse 76 01/28/20 1007  Resp 13 01/28/20 1007  SpO2 100 % 01/28/20 1007  Vitals shown include unvalidated device data.  Last Pain:  Vitals:   01/28/20 0858  TempSrc: Temporal  PainSc: 0-No pain         Complications: No apparent anesthesia complications

## 2020-01-29 ENCOUNTER — Encounter: Payer: Self-pay | Admitting: Gastroenterology

## 2020-01-29 ENCOUNTER — Encounter: Payer: Self-pay | Admitting: *Deleted

## 2020-01-29 LAB — SURGICAL PATHOLOGY

## 2020-01-30 ENCOUNTER — Encounter: Payer: Self-pay | Admitting: Family Medicine

## 2020-03-14 ENCOUNTER — Other Ambulatory Visit: Payer: Self-pay | Admitting: Family Medicine

## 2020-03-14 ENCOUNTER — Other Ambulatory Visit: Payer: Self-pay

## 2020-03-14 ENCOUNTER — Other Ambulatory Visit (INDEPENDENT_AMBULATORY_CARE_PROVIDER_SITE_OTHER): Payer: Commercial Managed Care - PPO

## 2020-03-14 DIAGNOSIS — R7989 Other specified abnormal findings of blood chemistry: Secondary | ICD-10-CM

## 2020-03-14 DIAGNOSIS — Z131 Encounter for screening for diabetes mellitus: Secondary | ICD-10-CM

## 2020-03-14 DIAGNOSIS — E7800 Pure hypercholesterolemia, unspecified: Secondary | ICD-10-CM | POA: Diagnosis not present

## 2020-03-14 DIAGNOSIS — Z13220 Encounter for screening for lipoid disorders: Secondary | ICD-10-CM

## 2020-03-14 LAB — LIPID PANEL
Cholesterol: 169 mg/dL (ref 0–200)
HDL: 50.9 mg/dL (ref >39.00)
LDL Cholesterol: 111 mg/dL — ABNORMAL HIGH (ref 0–99)
NonHDL: 118.53
Total CHOL/HDL Ratio: 3
Triglycerides: 40 mg/dL (ref 0.0–149.0)
VLDL: 8 mg/dL (ref 0.0–40.0)

## 2020-03-14 LAB — COMPREHENSIVE METABOLIC PANEL
ALT: 12 U/L (ref 0–35)
AST: 15 U/L (ref 0–37)
Albumin: 4.1 g/dL (ref 3.5–5.2)
Alkaline Phosphatase: 56 U/L (ref 39–117)
BUN: 7 mg/dL (ref 6–23)
CO2: 25 mEq/L (ref 19–32)
Calcium: 8.8 mg/dL (ref 8.4–10.5)
Chloride: 108 mEq/L (ref 96–112)
Creatinine, Ser: 0.55 mg/dL (ref 0.40–1.20)
GFR: 119.41 mL/min (ref >60.00)
Glucose, Bld: 102 mg/dL — ABNORMAL HIGH (ref 70–99)
Potassium: 3.8 mEq/L (ref 3.5–5.1)
Sodium: 139 mEq/L (ref 135–145)
Total Bilirubin: 0.4 mg/dL (ref 0.2–1.2)
Total Protein: 6.6 g/dL (ref 6.0–8.3)

## 2020-03-14 LAB — TSH: TSH: 3.04 u[IU]/mL (ref 0.35–4.50)

## 2020-03-14 LAB — T4, FREE: Free T4: 0.66 ng/dL (ref 0.60–1.60)

## 2020-03-21 ENCOUNTER — Ambulatory Visit (INDEPENDENT_AMBULATORY_CARE_PROVIDER_SITE_OTHER): Payer: Commercial Managed Care - PPO | Admitting: Family Medicine

## 2020-03-21 ENCOUNTER — Encounter: Payer: Self-pay | Admitting: Family Medicine

## 2020-03-21 ENCOUNTER — Other Ambulatory Visit: Payer: Self-pay

## 2020-03-21 DIAGNOSIS — E7800 Pure hypercholesterolemia, unspecified: Secondary | ICD-10-CM

## 2020-03-21 DIAGNOSIS — Z0000 Encounter for general adult medical examination without abnormal findings: Secondary | ICD-10-CM | POA: Diagnosis not present

## 2020-03-21 DIAGNOSIS — K5900 Constipation, unspecified: Secondary | ICD-10-CM | POA: Diagnosis not present

## 2020-03-21 MED ORDER — POLYETHYLENE GLYCOL 3350 17 GM/SCOOP PO POWD: 8.5000 g | Freq: Every day | ORAL | 1 refills | Status: DC | PRN

## 2020-03-21 NOTE — Assessment & Plan Note (Signed)
Preventative protocols reviewed and updated unless pt declined. Discussed healthy diet and lifestyle.  

## 2020-03-21 NOTE — Assessment & Plan Note (Signed)
With ext hemorrhoids leading to some bleeding. S/p reassuring colonoscopy earlier this year, treated with cream which she will start. Reviewed water and fiber in diet. May add miralax PRN.

## 2020-03-21 NOTE — Assessment & Plan Note (Addendum)
Levels overall normal. Will resolve.

## 2020-03-21 NOTE — Progress Notes (Addendum)
This visit was conducted in person.  BP 118/68 (BP Location: Left Arm, Patient Position: Sitting, Cuff Size: Normal)   Pulse 98   Temp 97.6 F (36.4 C) (Temporal)   Ht 5' (1.524 m)   Wt 122 lb (55.3 kg)   SpO2 98%   BMI 23.83 kg/m    CC: CPE  Subjective:    Patient ID: Olivia Estrada, female    DOB: 1976/11/10, 44 y.o.   MRN: [id number]  HPI: Olivia Estrada is a 44 y.o. female presenting on 03/21/2020 for Annual Exam   She uses hemp cream - asks about contents and effect on drug testing.  Ext hemorrhoids - some constipation noted despite regular fiber supplement (citrucel) without benefit. She does stay well hydrated.   Some ongoing polyarthralgias.   Preventative: Colonoscopy 01/2020 - done for rectal bleeding - 2 SSP rpt 5 yrs, ext hemorrhoids (source of bleed) ([place]) Well woman -pap 2018 HPV positive, HR neg otherwise normal. Pap abnormal 2019 - referred to GYN and followed by them since. Latest pap 10/2019 WNL Mammogram -mammo Birads 2 09/2019 - rpt 1 yr Flu - declines  Tdap -2017  COVID vaccine - Moderna 01/2020, 02/2020 completed series Seat belt use discussed Sunscreen use discussed. No changing moles on skin.  LMP currently. Regular periods.  Non smoker Alcohol - socially Dentist q6  Eye exam has not seen  Lives with husband and son, 1 dog and parrot Occupation: Materials engineer and house cleaning, works 5d/wk at [employer] Activity: walks 1 hour every day Diet: 8 glasses of water daily, fruits/vegteables daily     Relevant past medical, surgical, family and social history reviewed and updated as indicated. Interim medical history since our last visit reviewed. Allergies and medications reviewed and updated. Outpatient Medications Prior to Visit  Medication Sig Dispense Refill  . cyclobenzaprine (FLEXERIL) 10 MG tablet Take 10 mg by mouth at bedtime.    . meloxicam (MOBIC) 15 MG tablet Take 15 mg by mouth daily.    . Multiple Vitamin  (MULTIVITAMIN) tablet Take 1 tablet by mouth daily.     No facility-administered medications prior to visit.     Per HPI unless specifically indicated in ROS section below Review of Systems  Constitutional: Negative for activity change, appetite change, chills, fatigue, fever and unexpected weight change.  HENT: Negative for hearing loss.   Eyes: Negative for visual disturbance.  Respiratory: Negative for cough, chest tightness, shortness of breath and wheezing.   Cardiovascular: Negative for chest pain, palpitations and leg swelling.  Gastrointestinal: Positive for blood in stool and constipation. Negative for abdominal distention, abdominal pain, diarrhea, nausea and vomiting.  Genitourinary: Negative for difficulty urinating and hematuria.  Musculoskeletal: Negative for arthralgias, myalgias and neck pain.  Skin: Negative for rash.  Neurological: Negative for dizziness, seizures, syncope and headaches.  Hematological: Negative for adenopathy. Does not bruise/bleed easily.  Psychiatric/Behavioral: Negative for dysphoric mood. The patient is not nervous/anxious.    Objective:    BP 118/68 (BP Location: Left Arm, Patient Position: Sitting, Cuff Size: Normal)   Pulse 98   Temp 97.6 F (36.4 C) (Temporal)   Ht 5' (1.524 m)   Wt 122 lb (55.3 kg)   SpO2 98%   BMI 23.83 kg/m   Wt Readings from Last 3 Encounters:  03/21/20 122 lb (55.3 kg)  01/28/20 120 lb (54.4 kg)  10/06/19 121 lb 8 oz (55.1 kg)    Physical Exam Vitals and nursing note reviewed.  Constitutional:      General: She is not in acute distress.    Appearance: Normal appearance. She is well-developed. She is not ill-appearing.  HENT:     Head: Normocephalic and atraumatic.     Right Ear: Hearing, tympanic membrane, ear canal and external ear normal.     Left Ear: Hearing, tympanic membrane, ear canal and external ear normal.  Eyes:     General: No scleral icterus.    Extraocular Movements: Extraocular movements  intact.     Conjunctiva/sclera: Conjunctivae normal.     Pupils: Pupils are equal, round, and reactive to light.  Cardiovascular:     Rate and Rhythm: Normal rate and regular rhythm.     Pulses: Normal pulses.          Radial pulses are 2+ on the right side and 2+ on the left side.     Heart sounds: Normal heart sounds. No murmur.  Pulmonary:     Effort: Pulmonary effort is normal. No respiratory distress.     Breath sounds: Normal breath sounds. No wheezing, rhonchi or rales.  Abdominal:     General: Abdomen is flat. Bowel sounds are normal. There is no distension.     Palpations: Abdomen is soft. There is no mass.     Tenderness: There is no abdominal tenderness. There is no guarding or rebound.     Hernia: No hernia is present.  Musculoskeletal:        General: Normal range of motion.     Cervical back: Normal range of motion and neck supple.  Lymphadenopathy:     Cervical: No cervical adenopathy.  Skin:    General: Skin is warm and dry.     Findings: No lesion or rash.  Neurological:     General: No focal deficit present.     Mental Status: She is alert and oriented to person, place, and time.     Comments: CN grossly intact, station and gait intact  Psychiatric:        Mood and Affect: Mood normal.        Behavior: Behavior normal.        Thought Content: Thought content normal.        Judgment: Judgment normal.       Results for orders placed or performed in visit on 03/14/20  T4, free  Result Value Ref Range   Free T4 0.66 0.60 - 1.60 ng/dL  TSH  Result Value Ref Range   TSH 3.04 0.35 - 4.50 uIU/mL  Comprehensive metabolic panel  Result Value Ref Range   Sodium 139 135 - 145 mEq/L   Potassium 3.8 3.5 - 5.1 mEq/L   Chloride 108 96 - 112 mEq/L   CO2 25 19 - 32 mEq/L   Glucose, Bld 102 (H) 70 - 99 mg/dL   BUN 7 6 - 23 mg/dL   Creatinine, Ser 0.55 0.40 - 1.20 mg/dL   Total Bilirubin 0.4 0.2 - 1.2 mg/dL   Alkaline Phosphatase 56 39 - 117 U/L   AST 15 0 - 37 U/L    ALT 12 0 - 35 U/L   Total Protein 6.6 6.0 - 8.3 g/dL   Albumin 4.1 3.5 - 5.2 g/dL   GFR 119.41 >60.00 mL/min   Calcium 8.8 8.4 - 10.5 mg/dL  Lipid panel  Result Value Ref Range   Cholesterol 169 0 - 200 mg/dL   Triglycerides 40.0 0.0 - 149.0 mg/dL   HDL 50.90 >39.00 mg/dL   VLDL  8.0 0.0 - 40.0 mg/dL   LDL Cholesterol 111 (H) 0 - 99 mg/dL   Total CHOL/HDL Ratio 3    NonHDL 118.53    Depression screen [facility] 2/9 03/21/2020 01/06/2018  Decreased Interest 0 0  Down, Depressed, Hopeless 0 0  PHQ - 2 Score 0 0  Altered sleeping 0 -  Tired, decreased energy 0 -  Change in appetite 0 -  Feeling bad or failure about yourself  0 -  Trouble concentrating 0 -  Moving slowly or fidgety/restless 0 -  Suicidal thoughts 0 -  PHQ-9 Score 0 -   No flowsheet data found.  Assessment & Plan:  This visit occurred during the SARS-CoV-2 public health emergency.  Safety protocols were in place, including screening questions prior to the visit, additional usage of staff PPE, and extensive cleaning of exam room while observing appropriate contact time as indicated for disinfecting solutions.  Patient brings up concerns about charges for physical despite preventative coverage of her insurance. Discussed billing related to preventative vs monitoring.  Problem List Items Addressed This Visit    Pure hypercholesterolemia    Levels overall normal. Will resolve.       Health maintenance examination    Preventative protocols reviewed and updated unless pt declined. Discussed healthy diet and lifestyle.       Constipation    With ext hemorrhoids leading to some bleeding. S/p reassuring colonoscopy earlier this year, treated with cream which she will start. Reviewed water and fiber in diet. May add miralax PRN.           Meds ordered this encounter  Medications  . polyethylene glycol powder (GLYCOLAX/MIRALAX) 17 GM/SCOOP powder    Sig: Take 8.5-17 g by mouth daily as needed for moderate constipation.     Dispense:  3350 g    Refill:  1   No orders of the defined types were placed in this encounter.   Patient instructions: Busque Dr Rubie Maid gynecologia en [facility].  Siga mucha agua y [place] en la dieta o suplemento. si sigue con estrenimiento, aadir 1/2-1 tapa llena de miralax diaria. Regresar en 1 ao para proximo examen.   Follow up plan: Return if symptoms worsen or fail to improve.  Ria Bush, MD

## 2020-03-21 NOTE — Patient Instructions (Addendum)
Busque Dr Rubie Maid gynecologia en [facility].  Siga mucha agua y [place] en la dieta o suplemento. si sigue con estrenimiento, aadir 1/2-1 tapa llena de miralax diaria. Regresar en 1 ao para proximo examen.   Mantenimiento de Technical sales engineer en [place] Maintenance, Female Adoptar un estilo de vida saludable y recibir atencin preventiva son importantes para promover la salud y Musician. Consulte al mdico sobre:  El esquema adecuado para hacerse pruebas y exmenes peridicos.  Cosas que puede hacer por su cuenta para prevenir enfermedades y [employer]. Qu debo saber sobre la dieta, el peso y el ejercicio? Consuma una dieta saludable   Consuma una dieta que incluya muchas verduras, frutas, productos lcteos con bajo contenido de [place] y Advertising account planner.  No consuma muchos alimentos ricos en grasas slidas, azcares agregados o sodio. Mantenga un peso saludable El ndice de masa muscular [facility]) se [place] para identificar problemas de [place]. Proporciona una estimacin de la grasa corporal basndose en el peso y la altura. Su mdico puede ayudarle a Radiation protection practitioner [place] y a Scientist, forensic o Theatre manager un peso saludable. Haga ejercicio con regularidad Haga ejercicio con regularidad. Esta es una de las prcticas ms importantes que puede hacer por su salud. La mayora de los adultos deben seguir estas pautas:  Optometrist, al menos, 182minutos de actividad fsica por semana. El ejercicio debe aumentar la frecuencia cardaca y Nature conservation officer transpirar (ejercicio de intensidad moderada).  Hacer ejercicios de fortalecimiento por lo [employer] por semana. Agregue esto a su plan de ejercicio de intensidad moderada.  Pasar menos tiempo sentados. Incluso la actividad fsica ligera puede ser beneficiosa. Controle sus niveles de colesterol y lpidos en la sangre Comience a realizarse anlisis de lpidos y Research officer, trade union en la sangre a los 20aos y luego reptalos cada 5aos. Hgase controlar los  niveles de colesterol con mayor frecuencia si:  Sus niveles de lpidos y colesterol son altos.  Es mayor de 40aos.  Presenta un alto riesgo de padecer enfermedades cardacas. Qu debo saber sobre las pruebas de deteccin del cncer? Segn su historia clnica y sus antecedentes familiares, es posible que deba realizarse pruebas de deteccin del cncer en diferentes edades. Esto puede incluir pruebas de deteccin de lo siguiente:  Cncer de mama.  Cncer de cuello uterino.  Cncer colorrectal.  Cncer de piel.  Cncer de pulmn. Qu debo saber sobre la enfermedad cardaca, la diabetes y la hipertensin arterial? Presin arterial y enfermedad cardaca  La hipertensin arterial causa enfermedades cardacas y [place] el riesgo de accidente cerebrovascular. Es ms probable que esto se manifieste en las personas que tienen lecturas de presin arterial alta, tienen ascendencia africana o tienen sobrepeso.  Hgase controlar la presin arterial: ? Cada 3 a 5 aos si tiene entre 18 y 24 aos. ? Todos los aos si es mayor de [place]. Diabetes Realcese exmenes de deteccin de la diabetes con regularidad. Este anlisis revisa el nivel de azcar en la sangre en [place]. Hgase las pruebas de deteccin:  Cada tresaos despus de los 33aos de edad si tiene un peso normal y un bajo riesgo de padecer diabetes.  Con ms frecuencia y a partir de [place] edad inferior si tiene sobrepeso o un alto riesgo de padecer diabetes. Qu debo saber sobre la prevencin de infecciones? Hepatitis B Si tiene un riesgo ms alto de contraer hepatitis B, debe someterse a un examen de deteccin de este virus. Hable con el mdico para averiguar si tiene riesgo de contraer la infeccin por hepatitis  B. Hepatitis C Se recomienda el anlisis a:  [employer] 1945 y 1965.  Todas las personas que tengan un riesgo de haber contrado hepatitis C. Enfermedades de transmisin sexual (ETS)  Hgase las  pruebas de Programme researcher, broadcasting/film/video de ITS, incluidas la gonorrea y la clamidia, si: ? Es sexualmente activa y es menor de [place]. ? Es mayor de 24aos, y Investment banker, operational informa que corre riesgo de tener este tipo de infecciones. ? La actividad sexual ha cambiado desde que le hicieron la ltima prueba de deteccin y tiene un riesgo mayor de Best boy clamidia o Radio broadcast assistant. Pregntele al mdico si usted tiene riesgo.  Pregntele al mdico si usted tiene un alto riesgo de Museum/gallery curator VIH. El mdico tambin puede recomendarle un medicamento recetado para ayudar a evitar la infeccin por el VIH. Si elige tomar medicamentos para prevenir el VIH, primero debe [employer] de deteccin del VIH. Luego debe hacerse anlisis cada 64meses mientras est tomando los medicamentos. Embarazo  Si est por dejar de Librarian, academic (fase premenopusica) y usted puede quedar [place], busque asesoramiento antes de [place].  Tome de 400 a [id number] (mcg) de cido [employer] si [place].  Pida mtodos de control de la natalidad (anticonceptivos) si desea evitar un embarazo no deseado. Osteoporosis y [place] La osteoporosis es una enfermedad en la que los huesos pierden los minerales y la fuerza por el avance de la edad. El resultado pueden ser fracturas en los [place]. Si tiene 65aos o ms, o si est en riesgo de sufrir osteoporosis y fracturas, pregunte a su mdico si debe:  Hacerse pruebas de deteccin de prdida sea.  Tomar un suplemento de calcio o de vitamina D para reducir el riesgo de fracturas.  Recibir terapia de reemplazo hormonal (TRH) para tratar los sntomas de la menopausia. Siga estas instrucciones en su casa: Estilo de vida  No consuma ningn producto que contenga nicotina o tabaco, como cigarrillos, cigarrillos electrnicos y tabaco de Higher education careers adviser. Si necesita ayuda para dejar de fumar, consulte al mdico.  No consuma drogas.  No comparta agujas.  Solicite ayuda a su mdico si  necesita apoyo o informacin para abandonar las drogas. Consumo de alcohol  No beba alcohol si: ? Su mdico le indica no hacerlo. ? Est embarazada, puede estar embarazada o est tratando de quedar embarazada.  Si bebe alcohol: ? Limite la cantidad que consume de 0 a 1 medida por da. ? Limite la ingesta si est amamantando.  Est atento a la cantidad de alcohol que hay en las bebidas que toma. En los [place], una medida equivale a una botella de cerveza de 12oz (324ml), un vaso de vino de 5oz (138ml) o un vaso de una bebida alcohlica de alta graduacin de 1oz (7ml). Instrucciones generales  Realcese los estudios de rutina de la salud, dentales y de Public librarian.  [place].  Infrmele a su mdico si: ? Se siente deprimida con frecuencia. ? Alguna vez ha sido vctima de [place] o no se siente segura en su casa. Resumen  Adoptar un estilo de vida saludable y recibir atencin preventiva son importantes para promover la salud y Musician.  Siga las instrucciones del mdico acerca de una dieta saludable, el ejercicio y la realizacin de pruebas o exmenes para Engineer, building services.  Siga las instrucciones del mdico con respecto al control del colesterol y la presin arterial. Esta informacin no tiene Marine scientist el consejo del mdico. Asegrese de  hacerle al mdico cualquier pregunta que tenga. Document Revised: 12/10/2018 Document Reviewed: 12/10/2018 Elsevier Patient Education  [place].

## 2020-03-30 ENCOUNTER — Ambulatory Visit: Payer: Commercial Managed Care - PPO | Admitting: Obstetrics & Gynecology

## 2020-05-10 IMAGING — US US BREAST*L* LIMITED INC AXILLA
1 series · 4 of 4 positions shown · non-contrast
Comparison: Previous exam(s).

CLINICAL DATA: 43-year-old patient with palpable area in the mid
left axilla that she describes as nickel sized, an asymmetric
compared to the right axilla. She denies any tenderness in this
region during her menstrual cycles. She has noticed no changes in
the skin of her axilla.

EXAM:
DIGITAL DIAGNOSTIC BILATERAL MAMMOGRAM WITH CAD AND TOMO
ULTRASOUND LEFT BREAST

[Series 1: us breast*left* limited inc axilla · 0.08mm/px · 4 of 4 slices shown]
[im 1/4]
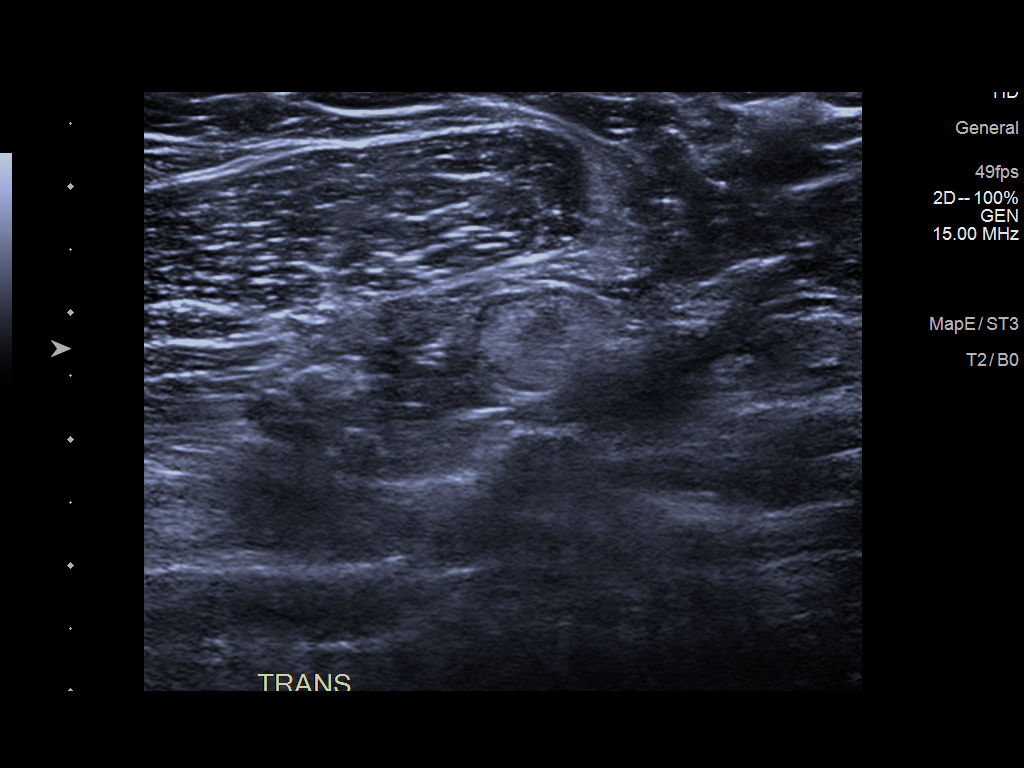
[im 2/4]
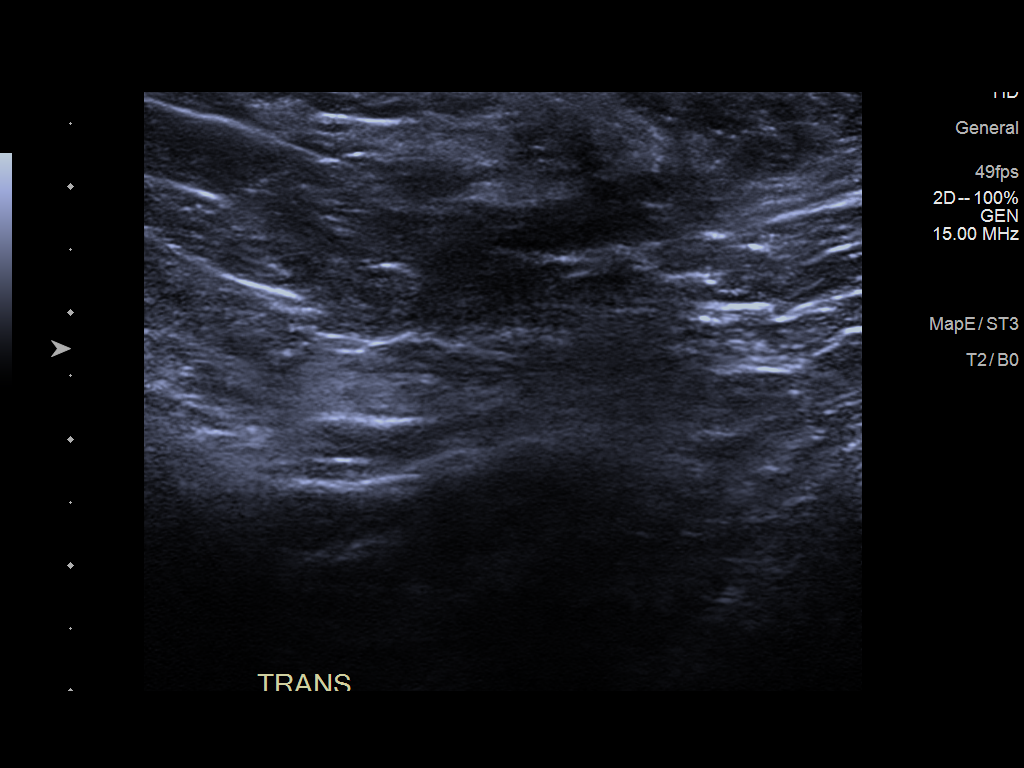
[im 3/4]
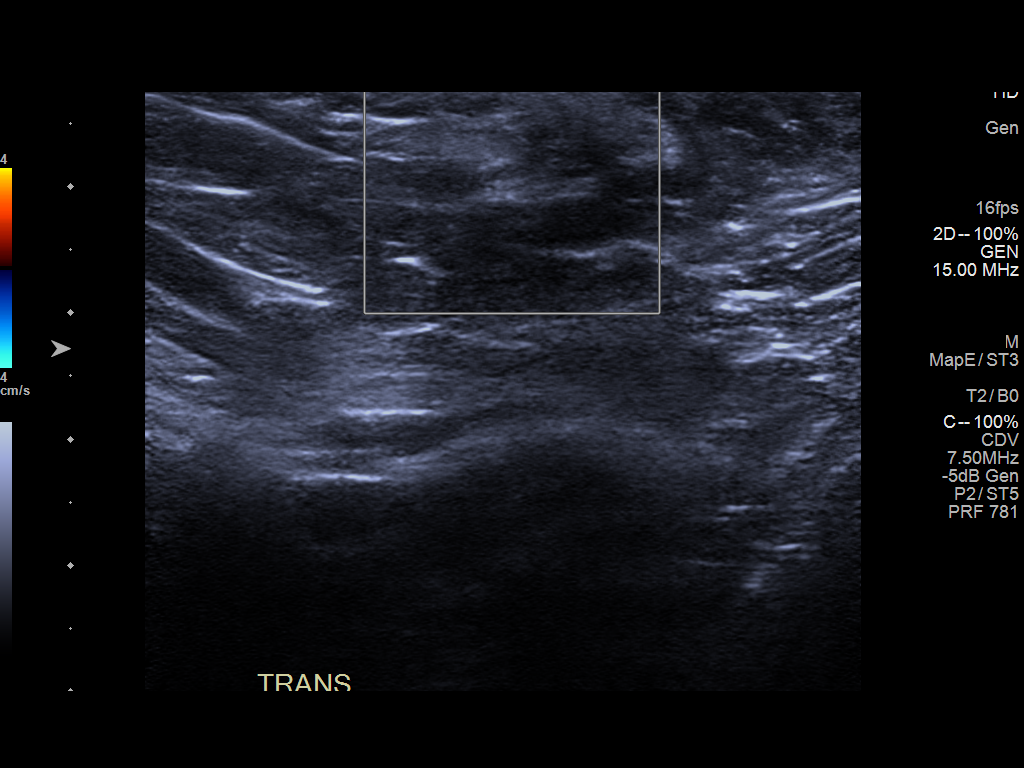
[im 4/4]
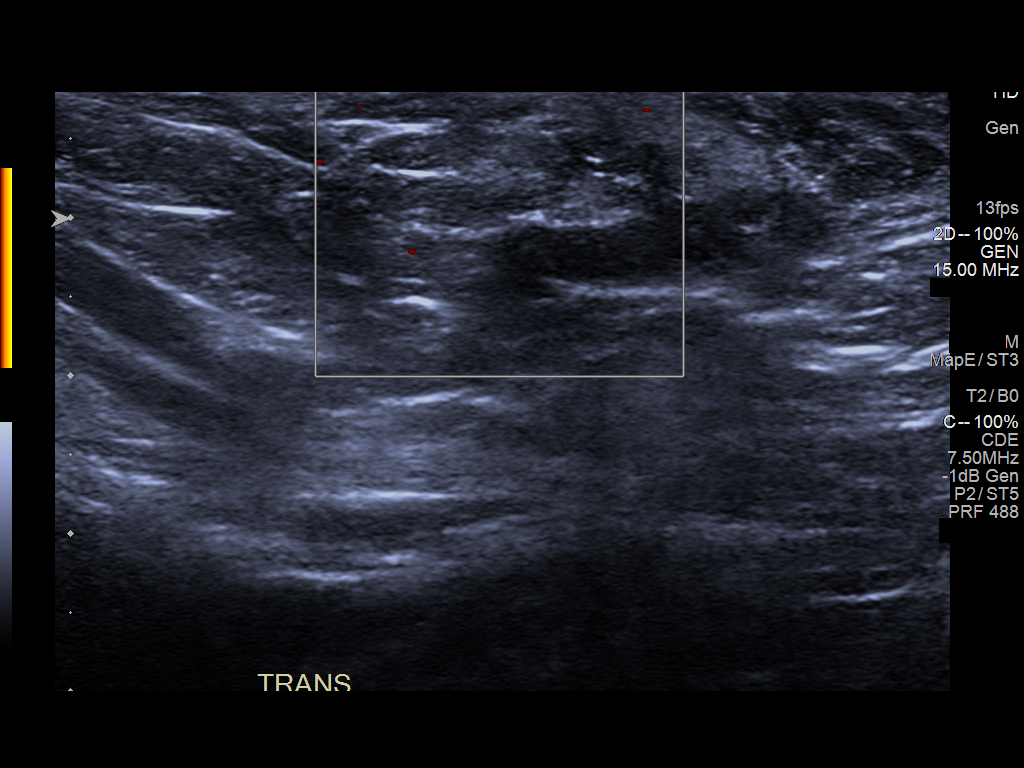

[4 of 4 positions shown; findings below may reference images not displayed]

ACR Breast Density Category d: The breast tissue is extremely dense,
which lowers the sensitivity of mammography.
FINDINGS: No mass, architectural distortion, or suspicious microcalcification
is identified to suggest malignancy in either breast. Spot
tangential view of the region of palpable concern in the left axilla
shows superficial densities with interspersed fat, favored to be due
to axillary breast tissue. No lymphadenopathy is seen.

Mammographic images were processed with CAD.

On physical exam, the skin of the left axilla is normal. I do not
palpate a discrete lump in the mid left axilla in the region of
patient concern. Specifically, no lymphadenopathy is palpated.

Targeted ultrasound is performed, showing normal left axillary lymph
nodes. Direct imaging of the region of patient palpable concern
shows superficial ductal structures and surrounding echogenic areas
consistent with axillary breast tissue. No suspicious mass or
hyperemia.
IMPRESSION: Benign left axillary breast tissue in the region of patient concern.
Negative for left axillary lymphadenopathy.

No evidence of malignancy in either breast.

RECOMMENDATION:
Screening mammogram in one year.(Code:[id number])

I have discussed the findings and recommendations with the patient.
If applicable, a reminder letter will be sent to the patient
regarding the next appointment.

BI-RADS CATEGORY  2: Benign.

## 2021-03-14 ENCOUNTER — Other Ambulatory Visit: Payer: Self-pay | Admitting: Family Medicine

## 2021-03-14 DIAGNOSIS — E7800 Pure hypercholesterolemia, unspecified: Secondary | ICD-10-CM

## 2021-03-15 ENCOUNTER — Other Ambulatory Visit: Payer: Commercial Managed Care - PPO

## 2021-03-22 ENCOUNTER — Ambulatory Visit (INDEPENDENT_AMBULATORY_CARE_PROVIDER_SITE_OTHER): Payer: Commercial Managed Care - PPO | Admitting: Obstetrics and Gynecology

## 2021-03-22 ENCOUNTER — Encounter: Payer: Commercial Managed Care - PPO | Admitting: Family Medicine

## 2021-03-22 ENCOUNTER — Other Ambulatory Visit: Payer: Self-pay

## 2021-03-22 ENCOUNTER — Encounter: Payer: Self-pay | Admitting: Obstetrics and Gynecology

## 2021-03-22 VITALS — BP 100/64 | HR 67 | Ht 60.0 in | Wt 124.9 lb

## 2021-03-22 DIAGNOSIS — Z8742 Personal history of other diseases of the female genital tract: Secondary | ICD-10-CM | POA: Diagnosis not present

## 2021-03-22 DIAGNOSIS — Z1231 Encounter for screening mammogram for malignant neoplasm of breast: Secondary | ICD-10-CM | POA: Diagnosis not present

## 2021-03-22 DIAGNOSIS — Q831 Accessory breast: Secondary | ICD-10-CM | POA: Diagnosis not present

## 2021-03-22 DIAGNOSIS — Z01419 Encounter for gynecological examination (general) (routine) without abnormal findings: Secondary | ICD-10-CM

## 2021-03-22 DIAGNOSIS — Z124 Encounter for screening for malignant neoplasm of cervix: Secondary | ICD-10-CM

## 2021-03-22 NOTE — Patient Instructions (Signed)
Preventive Care 84-46 Years Old, Female Preventive care refers to lifestyle choices and visits with your health care provider that can promote health and wellness. This includes:  A yearly physical exam. This is also called an annual wellness visit.  Regular dental and eye exams.  Immunizations.  Screening for certain conditions.  Healthy lifestyle choices, such as: ? Eating a healthy diet. ? Getting regular exercise. ? Not using drugs or products that contain nicotine and tobacco. ? Limiting alcohol use. What can I expect for my preventive care visit? Physical exam Your health care provider will check your:  Height and weight. These may be used to calculate your BMI (body mass index). BMI is a measurement that tells if you are at a healthy weight.  Heart rate and blood pressure.  Body temperature.  Skin for abnormal spots. Counseling Your health care provider may ask you questions about your:  Past medical problems.  Family's medical history.  Alcohol, tobacco, and drug use.  Emotional well-being.  Home life and relationship well-being.  Sexual activity.  Diet, exercise, and sleep habits.  Work and work Statistician.  Access to firearms.  Method of birth control.  Menstrual cycle.  Pregnancy history. What immunizations do I need? Vaccines are usually given at various ages, according to a schedule. Your health care provider will recommend vaccines for you based on your age, medical history, and lifestyle or other factors, such as travel or where you work.   What tests do I need? Blood tests  Lipid and cholesterol levels. These may be checked every 5 years, or more often if you are over 3 years old.  Hepatitis C test.  Hepatitis B test. Screening  Lung cancer screening. You may have this screening every year starting at age 73 if you have a 30-pack-year history of smoking and currently smoke or have quit within the past 15 years.  Colorectal cancer  screening. ? All adults should have this screening starting at age 52 and continuing until age 17. ? Your health care provider may recommend screening at age 49 if you are at increased risk. ? You will have tests every 1-10 years, depending on your results and the type of screening test.  Diabetes screening. ? This is done by checking your blood sugar (glucose) after you have not eaten for a while (fasting). ? You may have this done every 1-3 years.  Mammogram. ? This may be done every 1-2 years. ? Talk with your health care provider about when you should start having regular mammograms. This may depend on whether you have a family history of breast cancer.  BRCA-related cancer screening. This may be done if you have a family history of breast, ovarian, tubal, or peritoneal cancers.  Pelvic exam and Pap test. ? This may be done every 3 years starting at age 10. ? Starting at age 11, this may be done every 5 years if you have a Pap test in combination with an HPV test. Other tests  STD (sexually transmitted disease) testing, if you are at risk.  Bone density scan. This is done to screen for osteoporosis. You may have this scan if you are at high risk for osteoporosis. Talk with your health care provider about your test results, treatment options, and if necessary, the need for more tests. Follow these instructions at home: Eating and drinking  Eat a diet that includes fresh fruits and vegetables, whole grains, lean protein, and low-fat dairy products.  Take vitamin and mineral supplements  as recommended by your health care provider.  Do not drink alcohol if: ? Your health care provider tells you not to drink. ? You are pregnant, may be pregnant, or are planning to become pregnant.  If you drink alcohol: ? Limit how much you have to 0-1 drink a day. ? Be aware of how much alcohol is in your drink. In the [place] one drink equals one 12 oz bottle of beer (355 mL), one 5 oz glass of  wine (148 mL), or one 1 oz glass of hard liquor (44 mL).   Lifestyle  Take daily care of your teeth and gums. Brush your teeth every morning and night with fluoride toothpaste. Floss one time each day.  Stay active. Exercise for at least 30 minutes 5 or more days each week.  Do not use any products that contain nicotine or tobacco, such as cigarettes, e-cigarettes, and chewing tobacco. If you need help quitting, ask your health care provider.  Do not use drugs.  If you are sexually active, practice safe sex. Use a condom or other form of protection to prevent STIs (sexually transmitted infections).  If you do not wish to become pregnant, use a form of birth control. If you plan to become pregnant, see your health care provider for a prepregnancy visit.  If told by your health care provider, take low-dose aspirin daily starting at age 75.  Find healthy ways to cope with stress, such as: ? Meditation, yoga, or listening to music. ? Journaling. ? Talking to a trusted person. ? Spending time with friends and family. Safety  Always wear your seat belt while driving or riding in a vehicle.  Do not drive: ? If you have been drinking alcohol. Do not ride with someone who has been drinking. ? When you are tired or distracted. ? While texting.  Wear a helmet and other protective equipment during sports activities.  If you have firearms in your house, make sure you follow all gun safety procedures. What's next?  Visit your health care provider once a year for an annual wellness visit.  Ask your health care provider how often you should have your eyes and teeth checked.  Stay up to date on all vaccines. This information is not intended to replace advice given to you by your health care provider. Make sure you discuss any questions you have with your health care provider. Document Revised: 08/23/2020 Document Reviewed: 07/31/2018 Elsevier Patient Education  2021 [place] Breast self-awareness is knowing how your breasts look and feel. Doing breast self-awareness is important. It allows you to catch a breast problem early while it is still small and can be treated. All women should do breast self-awareness, including women who have had breast implants. Tell your doctor if you notice a change in your breasts. What you need:  A mirror.  A well-lit room. How to do a breast self-exam A breast self-exam is one way to learn what is normal for your breasts and to check for changes. To do a breast self-exam: Look for changes 1. Take off all the clothes above your waist. 2. Stand in front of a mirror in a room with good lighting. 3. Put your hands on your hips. 4. Push your hands down. 5. Look at your breasts and nipples in the mirror to see if one breast or nipple looks different from the other. Check to see if: ? The shape of one breast is different. ? The size of  one breast is different. ? There are wrinkles, dips, and bumps in one breast and not the other. 6. Look at each breast for changes in the skin, such as: ? Redness. ? Scaly areas. 7. Look for changes in your nipples, such as: ? Liquid around the nipples. ? Bleeding. ? Dimpling. ? Redness. ? A change in where the nipples are.   Feel for changes 1. Lie on your back on the floor. 2. Feel each breast. To do this, follow these steps: ? Pick a breast to feel. ? Put the arm closest to that breast above your head. ? Use your other arm to feel the nipple area of your breast. Feel the area with the pads of your three middle fingers by making small circles with your fingers. For the first circle, press lightly. For the second circle, press harder. For the third circle, press even harder. ? Keep making circles with your fingers at the different pressures as you move down your breast. Stop when you feel your ribs. ? Move your fingers a little toward the center of your body. ? Start making  circles with your fingers again, this time going up until you reach your collarbone. ? Keep making up-and-down circles until you reach your armpit. Remember to keep using the three pressures. ? Feel the other breast in the same way. 3. Sit or stand in the tub or shower. 4. With soapy water on your skin, feel each breast the same way you did in step 2 when you were lying on the floor.   Write down what you find Writing down what you find can help you remember what to tell your doctor. Write down:  What is normal for each breast.  Any changes you find in each breast, including: ? The kind of changes you find. ? Whether you have pain. ? Size and location of any lumps.  When you last had your menstrual period. General tips  Check your breasts every month.  If you are breastfeeding, the best time to check your breasts is after you feed your baby or after you use a breast pump.  If you get menstrual periods, the best time to check your breasts is 5-7 days after your menstrual period is over.  With time, you will become comfortable with the self-exam, and you will begin to know if there are changes in your breasts. Contact a doctor if you:  See a change in the shape or size of your breasts or nipples.  See a change in the skin of your breast or nipples, such as red or scaly skin.  Have fluid coming from your nipples that is not normal.  Find a lump or thick area that was not there before.  Have pain in your breasts.  Have any concerns about your breast health. Summary  Breast self-awareness includes looking for changes in your breasts, as well as feeling for changes within your breasts.  Breast self-awareness should be done in front of a mirror in a well-lit room.  You should check your breasts every month. If you get menstrual periods, the best time to check your breasts is 5-7 days after your menstrual period is over.  Let your doctor know of any changes you see in your  breasts, including changes in size, changes on the skin, pain or tenderness, or fluid from your nipples that is not normal. This information is not intended to replace advice given to you by your health care provider. Make sure  you discuss any questions you have with your health care provider. Document Revised: 07/08/2018 Document Reviewed: 07/08/2018 Elsevier Patient Education  [place].

## 2021-03-22 NOTE — Progress Notes (Signed)
Annual Exam-Pt stated that she was doing well.  

## 2021-03-22 NOTE — Progress Notes (Signed)
ANNUAL PREVENTATIVE CARE GYN  ENCOUNTER NOTE  Subjective:       Olivia Estrada is a 45 y.o. G2P2002 female here for a routine annual gynecologic exam.  Current complaints: 1.  Questions bump under left arm 2. History of abnormal pap   Denies difficulty breathing, respiratory distress, chest pain, and leg pain or swelling.  Gynecologic History  Patient's last menstrual period was 02/14/2021. Contraception: tubal ligation Last Pap: 10/06/2019. Results were: Neg/Neg Last mammogram: 09/29/2019. Results were: BI-RADS CATEGORY 2: Benign  Obstetric History  OB History  Gravida Para Term Preterm AB Living  2 2 2     2  SAB IAB Ectopic Multiple Live Births          2    # Outcome Date GA Lbr Len/2nd Weight Sex Delivery Anes PTL Lv  2 Term 10/18/02    M Vag-Spont Local N LIV  1 Term 09/10/93    F Vag-Spont None N LIV    Past Medical History:  Diagnosis Date  . Arthritis   . History of chicken pox     Past Surgical History:  Procedure Laterality Date  . COLONOSCOPY WITH PROPOFOL N/A 01/28/2020   SSP x2, rpt 5 yrs (Vanga, Rohini Reddy, MD)  . TUBAL LIGATION  2003    Current Outpatient Medications on File Prior to Visit  Medication Sig Dispense Refill  . Multiple Vitamin (MULTIVITAMIN) tablet Take 1 tablet by mouth daily.     No current facility-administered medications on file prior to visit.    No Known Allergies  Social History   Socioeconomic History  . Marital status: Married    Spouse name: Not on file  . Number of children: Not on file  . Years of education: Not on file  . Highest education level: Not on file  Occupational History  . Not on file  Tobacco Use  . Smoking status: Never Smoker  . Smokeless tobacco: Never Used  Vaping Use  . Vaping Use: Never used  Substance and Sexual Activity  . Alcohol use: Yes    Comment: socially. none last 24hrs  . Drug use: No  . Sexual activity: Yes    Birth control/protection: Surgical    Comment: Tubal ligation   Other Topics Concern  . 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 Diabetes Father   . Diabetes Maternal Grandfather   . Cancer Mother 61       kidney (non smoker)  . CAD Neg Hx   . Stroke Neg Hx   . Breast cancer Neg Hx   . Ovarian cancer Neg Hx   . Colon cancer Neg Hx     The following portions of the patient's history were reviewed and updated as appropriate: allergies, current medications, past family history, past medical history, past social history, past surgical history and problem list.  Review of Systems  ROS: Negative other   than what was reported above. Information obtained verbally from patient.    Objective:   BP 100/64   Pulse 67   Ht 5' (1.524 m)   Wt 56.7 kg   LMP 02/14/2021   BMI 24.39 kg/m    CONSTITUTIONAL: Well-developed, well-nourished female in no acute distress.   PSYCHIATRIC: Normal mood and affect. Normal behavior. Normal judgment and thought content.  NEUROLGIC: Alert and oriented to person, place, and time. Normal muscle tone coordination. No cranial nerve deficit noted.  EYES: Conjunctivae and EOM are normal.   SKIN: Skin is warm and dry. No rash noted. Not diaphoretic. No erythema. No pallor.  CARDIOVASCULAR: Normal heart rate noted, regular rhythm, no murmur.  RESPIRATORY: Clear to auscultation bilaterally. Effort and breath sounds normal, no problems with respiration noted.  BREASTS: Symmetric in size. No masses,  skin changes, nipple drainage, or lymphadenopathy.  ABDOMEN: Soft, normal bowel sounds, no distention noted.  No tenderness, rebound or guarding.   PELVIC: Patient declined exam, on menstrual cycle    MUSCULOSKELETAL: Normal range of motion. No tenderness.  No cyanosis, clubbing, or edema.    Assessment:   Annual gynecologic examination 45 y.o.   Contraception: tubal ligation   Normal BMI   Problem List Items Addressed This Visit   None   Visit Diagnoses    Encounter for well woman exam with routine gynecological exam    -  Primary   Relevant Orders   MM 3D SCREEN BREAST BILATERAL   Cytology - PAP   CBC   Comp Met (CMET)   HgB A1c   Lipid panel   Breast cancer screening by mammogram       History of abnormal cervical Pap smear       Axillary accessory breast tissue          Plan:  Pap: Not needed, due 2023  Mammogram: Ordered   Labs: see orders   Routine preventative health maintenance measures emphasized: Exercise/Diet/Weight control, Tobacco Warnings, Alcohol/Substance use risks and Stress Management  Reviewed red flag symptoms and when to call  RTC x 1 year for ANNUAL EXAM with Dr. Cherry or sooner if needed.   Lauren M Huggins, Student-MidWife  [employer] 03/22/21 12:33 PM  

## 2021-03-23 LAB — COMPREHENSIVE METABOLIC PANEL
ALT: 18 IU/L (ref 0–32)
AST: 19 IU/L (ref 0–40)
Albumin/Globulin Ratio: 1.7 (ref 1.2–2.2)
Albumin: 4.8 g/dL (ref 3.8–4.8)
Alkaline Phosphatase: 69 IU/L (ref 44–121)
BUN/Creatinine Ratio: 16 (ref 9–23)
BUN: 9 mg/dL (ref 6–24)
Bilirubin Total: 0.5 mg/dL (ref 0.0–1.2)
CO2: 22 mmol/L (ref 20–29)
Calcium: 9.5 mg/dL (ref 8.7–10.2)
Chloride: 105 mmol/L (ref 96–106)
Creatinine, Ser: 0.55 mg/dL — ABNORMAL LOW (ref 0.57–1.00)
Globulin, Total: 2.9 g/dL (ref 1.5–4.5)
Glucose: 84 mg/dL (ref 65–99)
Potassium: 4.2 mmol/L (ref 3.5–5.2)
Sodium: 143 mmol/L (ref 134–144)
Total Protein: 7.7 g/dL (ref 6.0–8.5)
eGFR: 114 mL/min/{1.73_m2} (ref >59)

## 2021-03-23 LAB — CBC
Hematocrit: 41.6 % (ref 34.0–46.6)
Hemoglobin: 13.9 g/dL (ref 11.1–15.9)
MCH: 30.2 pg (ref 26.6–33.0)
MCHC: 33.4 g/dL (ref 31.5–35.7)
MCV: 90 fL (ref 79–97)
Platelets: 267 10*3/uL (ref 150–450)
RBC: 4.6 x10E6/uL (ref 3.77–5.28)
RDW: 12.6 % (ref 11.7–15.4)
WBC: 5.1 10*3/uL (ref 3.4–10.8)

## 2021-03-23 LAB — LIPID PANEL
Chol/HDL Ratio: 3.9 ratio (ref 0.0–4.4)
Cholesterol, Total: 239 mg/dL — ABNORMAL HIGH (ref 100–199)
HDL: 62 mg/dL (ref >39)
LDL Chol Calc (NIH): 166 mg/dL — ABNORMAL HIGH (ref 0–99)
Triglycerides: 66 mg/dL (ref 0–149)
VLDL Cholesterol Cal: 11 mg/dL (ref 5–40)

## 2021-03-23 LAB — HEMOGLOBIN A1C
Est. average glucose Bld gHb Est-mCnc: 111 mg/dL
Hgb A1c MFr Bld: 5.5 % (ref 4.8–5.6)

## 2021-08-03 HISTORY — PX: SHOULDER ARTHROSCOPY WITH DISTAL CLAVICLE RESECTION: SHX5675

## 2021-10-22 ENCOUNTER — Encounter: Payer: Self-pay | Admitting: Family Medicine

## 2021-10-30 ENCOUNTER — Other Ambulatory Visit: Payer: Self-pay | Admitting: Obstetrics and Gynecology

## 2021-10-30 DIAGNOSIS — N6320 Unspecified lump in the left breast, unspecified quadrant: Secondary | ICD-10-CM

## 2022-04-17 ENCOUNTER — Ambulatory Visit (INDEPENDENT_AMBULATORY_CARE_PROVIDER_SITE_OTHER): Payer: Commercial Managed Care - PPO | Admitting: Family Medicine

## 2022-04-17 ENCOUNTER — Encounter: Payer: Self-pay | Admitting: Family Medicine

## 2022-04-17 VITALS — BP 118/68 | HR 67 | Temp 97.6°F | Ht 60.25 in | Wt 123.2 lb

## 2022-04-17 DIAGNOSIS — E049 Nontoxic goiter, unspecified: Secondary | ICD-10-CM | POA: Diagnosis not present

## 2022-04-17 DIAGNOSIS — R5382 Chronic fatigue, unspecified: Secondary | ICD-10-CM

## 2022-04-17 DIAGNOSIS — Z1159 Encounter for screening for other viral diseases: Secondary | ICD-10-CM | POA: Diagnosis not present

## 2022-04-17 DIAGNOSIS — Z0000 Encounter for general adult medical examination without abnormal findings: Secondary | ICD-10-CM

## 2022-04-17 DIAGNOSIS — R87618 Other abnormal cytological findings on specimens from cervix uteri: Secondary | ICD-10-CM

## 2022-04-17 DIAGNOSIS — K5900 Constipation, unspecified: Secondary | ICD-10-CM

## 2022-04-17 DIAGNOSIS — E7800 Pure hypercholesterolemia, unspecified: Secondary | ICD-10-CM | POA: Diagnosis not present

## 2022-04-17 LAB — COMPREHENSIVE METABOLIC PANEL
ALT: 16 U/L (ref 0–35)
AST: 17 U/L (ref 0–37)
Albumin: 4.6 g/dL (ref 3.5–5.2)
Alkaline Phosphatase: 57 U/L (ref 39–117)
BUN: 9 mg/dL (ref 6–23)
CO2: 28 mEq/L (ref 19–32)
Calcium: 9.4 mg/dL (ref 8.4–10.5)
Chloride: 103 mEq/L (ref 96–112)
Creatinine, Ser: 0.47 mg/dL (ref 0.40–1.20)
GFR: 113.46 mL/min (ref >60.00)
Glucose, Bld: 92 mg/dL (ref 70–99)
Potassium: 4.2 mEq/L (ref 3.5–5.1)
Sodium: 138 mEq/L (ref 135–145)
Total Bilirubin: 0.7 mg/dL (ref 0.2–1.2)
Total Protein: 7.2 g/dL (ref 6.0–8.3)

## 2022-04-17 LAB — CBC WITH DIFFERENTIAL/PLATELET
Basophils Absolute: 0.1 10*3/uL (ref 0.0–0.1)
Basophils Relative: 1.3 % (ref 0.0–3.0)
Eosinophils Absolute: 0.3 10*3/uL (ref 0.0–0.7)
Eosinophils Relative: 8.1 % — ABNORMAL HIGH (ref 0.0–5.0)
HCT: 40.4 % (ref 36.0–46.0)
Hemoglobin: 13.4 g/dL (ref 12.0–15.0)
Lymphocytes Relative: 27.9 % (ref 12.0–46.0)
Lymphs Abs: 1.2 10*3/uL (ref 0.7–4.0)
MCHC: 33.1 g/dL (ref 30.0–36.0)
MCV: 92.2 fl (ref 78.0–100.0)
Monocytes Absolute: 0.3 10*3/uL (ref 0.1–1.0)
Monocytes Relative: 6.5 % (ref 3.0–12.0)
Neutro Abs: 2.3 10*3/uL (ref 1.4–7.7)
Neutrophils Relative %: 56.2 % (ref 43.0–77.0)
Platelets: 230 10*3/uL (ref 150.0–400.0)
RBC: 4.39 Mil/uL (ref 3.87–5.11)
RDW: 13.8 % (ref 11.5–15.5)
WBC: 4.2 10*3/uL (ref 4.0–10.5)

## 2022-04-17 LAB — LIPID PANEL
Cholesterol: 221 mg/dL — ABNORMAL HIGH (ref 0–200)
HDL: 65.3 mg/dL (ref >39.00)
LDL Cholesterol: 139 mg/dL — ABNORMAL HIGH (ref 0–99)
NonHDL: 155.96
Total CHOL/HDL Ratio: 3
Triglycerides: 84 mg/dL (ref 0.0–149.0)
VLDL: 16.8 mg/dL (ref 0.0–40.0)

## 2022-04-17 LAB — SEDIMENTATION RATE: Sed Rate: 14 mm/hr (ref 0–20)

## 2022-04-17 LAB — T4, FREE: Free T4: 0.65 ng/dL (ref 0.60–1.60)

## 2022-04-17 LAB — TSH: TSH: 2.77 u[IU]/mL (ref 0.35–5.50)

## 2022-04-17 MED ORDER — MAGNESIUM 200 MG PO TABS: 1.0000 | ORAL_TABLET | Freq: Every day | ORAL | Status: AC

## 2022-04-17 MED ORDER — IBUPROFEN 200 MG PO TABS: 200.0000 mg | ORAL_TABLET | Freq: Four times a day (QID) | ORAL | Status: AC | PRN

## 2022-04-17 MED ORDER — MELATONIN 5 MG PO TABS: 5.0000 mg | ORAL_TABLET | Freq: Every evening | ORAL | 0 refills | Status: AC | PRN

## 2022-04-17 MED ORDER — IRON (FERROUS SULFATE) 325 (65 FE) MG PO TABS: 325.0000 mg | ORAL_TABLET | ORAL | Status: AC

## 2022-04-17 MED ORDER — FISH OIL 1200 MG PO CAPS: 1.0000 | ORAL_CAPSULE | Freq: Every day | ORAL | Status: AC

## 2022-04-17 NOTE — Assessment & Plan Note (Signed)
Update labs including TSH and CBC.  ?

## 2022-04-17 NOTE — Patient Instructions (Addendum)
Labs today  ?Call to schedule mammogram at your convenience: [facility] [phone] ?Mandeme fechas de vacunas COVID booster para actualizar sus records.  ?Gusto verla hoy. ?Regresar en 1 a?o para proximo examen fisico.  ? ?Health Maintenance, Female ?Adopting a healthy lifestyle and getting preventive care are important in promoting health and wellness. Ask your health care provider about: ?The right schedule for you to have regular tests and exams. ?Things you can do on your own to prevent diseases and keep yourself healthy. ?What should I know about diet, weight, and exercise? ?Eat a healthy diet ? ?Eat a diet that includes plenty of vegetables, fruits, low-fat dairy products, and lean protein. ?Do not eat a lot of foods that are high in solid fats, added sugars, or sodium. ?Maintain a healthy weight ?Body mass index (BMI) is used to identify weight problems. It estimates body fat based on height and weight. Your health care provider can help determine your BMI and help you achieve or maintain a healthy weight. ?Get regular exercise ?Get regular exercise. This is one of the most important things you can do for your health. Most adults should: ?Exercise for at least 150 minutes each week. The exercise should increase your heart rate and make you sweat (moderate-intensity exercise). ?Do strengthening exercises at least twice a week. This is in addition to the moderate-intensity exercise. ?Spend less time sitting. Even light physical activity can be beneficial. ?Watch cholesterol and blood lipids ?Have your blood tested for lipids and cholesterol at 47 years of age, then have this test every 5 years. ?Have your cholesterol levels checked more often if: ?Your lipid or cholesterol levels are high. ?You are older than 47 years of age. ?You are at high risk for heart disease. ?What should I know about cancer screening? ?Depending on your health history and family history, you may need to have  cancer screening at various ages. This may include screening for: ?Breast cancer. ?Cervical cancer. ?Colorectal cancer. ?Skin cancer. ?Lung cancer. ?What should I know about heart disease, diabetes, and high blood pressure? ?Blood pressure and heart disease ?High blood pressure causes heart disease and increases the risk of stroke. This is more likely to develop in people who have high blood pressure readings or are overweight. ?Have your blood pressure checked: ?Every 3-5 years if you are 59-21 years of age. ?Every year if you are 19 years old or older. ?Diabetes ?Have regular diabetes screenings. This checks your fasting blood sugar level. Have the screening done: ?Once every three years after age 16 if you are at a normal weight and have a low risk for diabetes. ?More often and at a younger age if you are overweight or have a high risk for diabetes. ?What should I know about preventing infection? ?Hepatitis B ?If you have a higher risk for hepatitis B, you should be screened for this virus. Talk with your health care provider to find out if you are at risk for hepatitis B infection. ?Hepatitis C ?Testing is recommended for: ?Everyone born from [birth year] through 1966. ?Anyone with known risk factors for hepatitis C. ?Sexually transmitted infections (STIs) ?Get screened for STIs, including gonorrhea and chlamydia, if: ?You are sexually active and are younger than 47 years of age. ?You are older than 47 years of age and your health care provider tells you that you are at risk for this type of infection. ?Your sexual activity has changed since you were last screened, and you are at increased risk  for chlamydia or gonorrhea. Ask your health care provider if you are at risk. ?Ask your health care provider about whether you are at high risk for HIV. Your health care provider may recommend a prescription medicine to help prevent HIV infection. If you choose to take medicine to prevent HIV, you should first get tested for HIV.  You should then be tested every 3 months for as long as you are taking the medicine. ?Pregnancy ?If you are about to stop having your period (premenopausal) and you may become pregnant, seek counseling before you get pregnant. ?Take 400 to 800 micrograms (mcg) of folic acid every day if you become pregnant. ?Ask for birth control (contraception) if you want to prevent pregnancy. ?Osteoporosis and menopause ?Osteoporosis is a disease in which the bones lose minerals and strength with aging. This can result in bone fractures. If you are 29 years old or older, or if you are at risk for osteoporosis and fractures, ask your health care provider if you should: ?Be screened for bone loss. ?Take a calcium or vitamin D supplement to lower your risk of fractures. ?Be given hormone replacement therapy (HRT) to treat symptoms of menopause. ?Follow these instructions at home: ?Alcohol use ?Do not drink alcohol if: ?Your health care provider tells you not to drink. ?You are pregnant, may be pregnant, or are planning to become pregnant. ?If you drink alcohol: ?Limit how much you have to: ?0-1 drink a day. ?Know how much alcohol is in your drink. In the [place] one drink equals one 12 oz bottle of beer (355 mL), one 5 oz glass of wine (148 mL), or one 1? oz glass of hard liquor (44 mL). ?Lifestyle ?Do not use any products that contain nicotine or tobacco. These products include cigarettes, chewing tobacco, and vaping devices, such as e-cigarettes. If you need help quitting, ask your health care provider. ?Do not use street drugs. ?Do not share needles. ?Ask your health care provider for help if you need support or information about quitting drugs. ?General instructions ?Schedule regular health, dental, and eye exams. ?Stay current with your vaccines. ?Tell your health care provider if: ?You often feel depressed. ?You have ever been abused or do not feel safe at home. ?Summary ?Adopting a healthy lifestyle and getting preventive care  are important in promoting health and wellness. ?Follow your health care provider's instructions about healthy diet, exercising, and getting tested or screened for diseases. ?Follow your health care provider's instructions on monitoring your cholesterol and blood pressure. ?This information is not intended to replace advice given to you by your health care provider. Make sure you discuss any questions you have with your health care provider. ?Document Revised: 04/10/2021 Document Reviewed: 04/10/2021 ?Elsevier Patient Education ? [place]. ? ?

## 2022-04-17 NOTE — Assessment & Plan Note (Addendum)
Update FLP. Discussed elevated LDL - and dietary recommendations to improve control  ?The 10-year ASCVD risk score (Arnett DK, et al., 2019) is: 0.9% ?  Values used to calculate the score: ?    Age: 46 years ?    Sex: Female ?    Is Non-Hispanic African American: No ?    Diabetic: No ?    Tobacco smoker: No ?    Systolic Blood Pressure: 741 mmHg ?    Is BP treated: No ?    HDL Cholesterol: 62 mg/dL ?    Total Cholesterol: 239 mg/dL  ?

## 2022-04-17 NOTE — Assessment & Plan Note (Signed)
Hasn't found soluble fiber supplements helpful, nor miralax.  ?

## 2022-04-17 NOTE — Progress Notes (Signed)
? ? Patient ID: Olivia Estrada, female    DOB: 1976/11/04, 46 y.o.   MRN: [id number] ? ?This visit was conducted in person. ? ?BP 118/68   Pulse 67   Temp 97.6 ?F (36.4 ?C) (Temporal)   Ht 5' 0.25" (1.53 m)   Wt 123 lb 4 oz (55.9 kg)   LMP 03/12/2022   SpO2 98%   BMI 23.87 kg/m?   ? ?CC: CPE ?Subjective:  ? ?HPI: ?Olivia Estrada is a 46 y.o. female presenting on 04/17/2022 for Annual Exam ? ? ?R shoulder surgery by [facility] 10/2021. She did undergo physical therapy. Notes ongoing shoulder discomfort.  ? ?Polyarthralgias - ongoing aches, managed with ibuprofen.  ? ?Notes ongoing fatigue since she turned 39. Has had labs tested and reassuringly normal. Most noticeable when she arrives home. Previously walked 1 hour/day, now only can walk 30 min/day due to fatigue. No significant sleep apnea symptoms.  ? ?Preventative: ?Colonoscopy 01/2020 - done for rectal bleeding - 2 SSP, rpt 5 yrs, ext hemorrhoids (source of bleed) ([place])  ?Well woman - pap 2018 HPV positive, HR neg otherwise normal. Pap abnormal 2019 - referred to GYN and followed by them since. Now sees [facility]. Latest pap 10/2021 WNL, HPV neg.  ?LMP 03/2022, regular.  ?Mammogram - Birads2 09/2019 - rpt 1 yr ?Flu - yearly ?Tdap - 2017   ?COVID vaccine - Moderna 01/2020, 02/2020, booster x2 ?Seat belt use discussed ?Sunscreen use discussed. No changing moles on skin. Sees dermatology ?Sleep - averaging 7-8 hours/night  ?Non smoker  ?Alcohol - socially  ?Dentist q6  ?Eye exam has not seen  ? ?Lives with husband and son, 1 dog and parrot ?Occupation: Materials engineer and house cleaning, works 5d/wk at [employer] ?Activity: walks 1 hour every day ?Diet: 8 glasses of water daily, fruits/vegteables daily ?   ? ?Relevant past medical, surgical, family and social history reviewed and updated as indicated. Interim medical history since our last visit reviewed. ?Allergies and medications reviewed and updated. ?Outpatient Medications Prior to  Visit  ?Medication Sig Dispense Refill  ? Multiple Vitamin (MULTIVITAMIN) tablet Take 1 tablet by mouth daily.    ? ?No facility-administered medications prior to visit.  ?  ? ?Per HPI unless specifically indicated in ROS section below ?Review of Systems  ?Constitutional:  Positive for fatigue. Negative for activity change, appetite change, chills, fever and unexpected weight change.  ?HENT:  Negative for hearing loss.   ?Eyes:  Negative for visual disturbance.  ?Respiratory:  Negative for cough, chest tightness, shortness of breath and wheezing.   ?Cardiovascular:  Negative for chest pain, palpitations and leg swelling.  ?Gastrointestinal:  Positive for constipation. Negative for abdominal distention, abdominal pain, blood in stool, diarrhea, nausea and vomiting.  ?Genitourinary:  Negative for difficulty urinating and hematuria.  ?Musculoskeletal:  Negative for arthralgias, myalgias and neck pain.  ?Skin:  Negative for rash.  ?Neurological:  Negative for dizziness, seizures, syncope and headaches.  ?Hematological:  Negative for adenopathy. Does not bruise/bleed easily.  ?Psychiatric/Behavioral:  Negative for dysphoric mood. The patient is not nervous/anxious.   ? ?Objective:  ?BP 118/68   Pulse 67   Temp 97.6 ?F (36.4 ?C) (Temporal)   Ht 5' 0.25" (1.53 m)   Wt 123 lb 4 oz (55.9 kg)   LMP 03/12/2022   SpO2 98%   BMI 23.87 kg/m?   ?Wt Readings from Last 3 Encounters:  ?04/17/22 123 lb 4 oz (55.9 kg)  ?03/22/21 124 lb  14.4 oz (56.7 kg)  ?03/21/20 122 lb (55.3 kg)  ?  ?  ?Physical Exam ?Vitals and nursing note reviewed.  ?Constitutional:   ?   Appearance: Normal appearance. She is not ill-appearing.  ?HENT:  ?   Head: Normocephalic and atraumatic.  ?   Right Ear: Tympanic membrane, ear canal and external ear normal. There is no impacted cerumen.  ?   Left Ear: Tympanic membrane, ear canal and external ear normal. There is no impacted cerumen.  ?Eyes:  ?   General:     ?   Right eye: No discharge.     ?   Left  eye: No discharge.  ?   Extraocular Movements: Extraocular movements intact.  ?   Conjunctiva/sclera: Conjunctivae normal.  ?   Pupils: Pupils are equal, round, and reactive to light.  ?Neck:  ?   Thyroid: Thyromegaly (R sided) present. No thyroid mass or thyroid tenderness.  ?Cardiovascular:  ?   Rate and Rhythm: Normal rate and regular rhythm.  ?   Pulses: Normal pulses.  ?   Heart sounds: Normal heart sounds. No murmur heard. ?Pulmonary:  ?   Effort: Pulmonary effort is normal. No respiratory distress.  ?   Breath sounds: Normal breath sounds. No wheezing, rhonchi or rales.  ?Abdominal:  ?   General: Bowel sounds are normal. There is no distension.  ?   Palpations: Abdomen is soft. There is no mass.  ?   Tenderness: There is no abdominal tenderness. There is no guarding or rebound.  ?   Hernia: No hernia is present.  ?Musculoskeletal:  ?   Cervical back: Normal range of motion and neck supple. No rigidity.  ?   Right lower leg: No edema.  ?   Left lower leg: No edema.  ?Lymphadenopathy:  ?   Cervical: No cervical adenopathy.  ?Skin: ?   General: Skin is warm and dry.  ?   Findings: No rash.  ?Neurological:  ?   General: No focal deficit present.  ?   Mental Status: She is alert. Mental status is at baseline.  ?Psychiatric:     ?   Mood and Affect: Mood normal.     ?   Behavior: Behavior normal.  ? ?   ?Results for orders placed or performed in visit on 03/22/21  ?CBC  ?Result Value Ref Range  ? WBC 5.1 3.4 - 10.8 x10E3/uL  ? RBC 4.60 3.77 - 5.28 x10E6/uL  ? Hemoglobin 13.9 11.1 - 15.9 g/dL  ? Hematocrit 41.6 34.0 - 46.6 %  ? MCV 90 79 - 97 fL  ? MCH 30.2 26.6 - 33.0 pg  ? MCHC 33.4 31.5 - 35.7 g/dL  ? RDW 12.6 11.7 - 15.4 %  ? Platelets 267 150 - 450 x10E3/uL  ?Comp Met (CMET)  ?Result Value Ref Range  ? Glucose 84 65 - 99 mg/dL  ? BUN 9 6 - 24 mg/dL  ? Creatinine, Ser 0.55 (L) 0.57 - 1.00 mg/dL  ? eGFR 114 >59 mL/min/1.73  ? BUN/Creatinine Ratio 16 9 - 23  ? Sodium 143 134 - 144 mmol/L  ? Potassium 4.2 3.5 - 5.2  mmol/L  ? Chloride 105 96 - 106 mmol/L  ? CO2 22 20 - 29 mmol/L  ? Calcium 9.5 8.7 - 10.2 mg/dL  ? Total Protein 7.7 6.0 - 8.5 g/dL  ? Albumin 4.8 3.8 - 4.8 g/dL  ? Globulin, Total 2.9 1.5 - 4.5 g/dL  ? Albumin/Globulin Ratio 1.7 1.2 -  2.2  ? Bilirubin Total 0.5 0.0 - 1.2 mg/dL  ? Alkaline Phosphatase 69 44 - 121 IU/L  ? AST 19 0 - 40 IU/L  ? ALT 18 0 - 32 IU/L  ?HgB A1c  ?Result Value Ref Range  ? Hgb A1c MFr Bld 5.5 4.8 - 5.6 %  ? Est. average glucose Bld gHb Est-mCnc 111 mg/dL  ?Lipid panel  ?Result Value Ref Range  ? Cholesterol, Total 239 (H) 100 - 199 mg/dL  ? Triglycerides 66 0 - 149 mg/dL  ? HDL 62 >39 mg/dL  ? VLDL Cholesterol Cal 11 5 - 40 mg/dL  ? LDL Chol Calc (NIH) 166 (H) 0 - 99 mg/dL  ? Chol/HDL Ratio 3.9 0.0 - 4.4 ratio  ? ? ?Assessment & Plan:  ? ?Problem List Items Addressed This Visit   ? ? Health maintenance examination - Primary (Chronic)  ?  Preventative protocols reviewed and updated unless pt declined. ?Discussed healthy diet and lifestyle.  ? ?  ?  ? Pap smear abnormality of cervix/human papillomavirus (HPV) positive  ?  H/o abnormal pap. Latest pap with GYN WNL, HPV neg.  ? ?  ?  ? Pure hypercholesterolemia  ?  Update FLP. Discussed elevated LDL - and dietary recommendations to improve control  ?The 10-year ASCVD risk score (Arnett DK, et al., 2019) is: 0.9% ?  Values used to calculate the score: ?    Age: 64 years ?    Sex: Female ?    Is Non-Hispanic African American: No ?    Diabetic: No ?    Tobacco smoker: No ?    Systolic Blood Pressure: 701 mmHg ?    Is BP treated: No ?    HDL Cholesterol: 62 mg/dL ?    Total Cholesterol: 239 mg/dL  ? ?  ?  ? Relevant Orders  ? Lipid panel  ? Comprehensive metabolic panel  ? TSH  ? Constipation  ?  Hasn't found soluble fiber supplements helpful, nor miralax.  ? ?  ?  ? Chronic fatigue  ?  Update labs including TSH and CBC.  ? ?  ?  ? Relevant Orders  ? Sedimentation rate  ? CBC with Differential/Platelet  ? Thyroid enlargement  ?  Asxs.  ?Update TSH,  fT4, anti-TPO.  ? ?  ?  ? Relevant Orders  ? T4, free  ? Thyroid Peroxidase Antibody  ? ?Other Visit Diagnoses   ? ? Need for hepatitis C screening test      ? Relevant Orders  ? Hepatitis C antibody  ? ?  ?  ? ?Meds o

## 2022-04-17 NOTE — Assessment & Plan Note (Signed)
H/o abnormal pap. Latest pap with GYN WNL, HPV neg.  ?

## 2022-04-17 NOTE — Assessment & Plan Note (Addendum)
Asxs.  ?Update TSH, fT4, anti-TPO.  ?

## 2022-04-17 NOTE — Assessment & Plan Note (Signed)
Preventative protocols reviewed and updated unless pt declined. Discussed healthy diet and lifestyle.  

## 2022-04-19 ENCOUNTER — Other Ambulatory Visit: Payer: Self-pay | Admitting: Family Medicine

## 2022-04-19 ENCOUNTER — Other Ambulatory Visit: Payer: Self-pay | Admitting: Obstetrics and Gynecology

## 2022-04-19 DIAGNOSIS — Z1231 Encounter for screening mammogram for malignant neoplasm of breast: Secondary | ICD-10-CM

## 2022-04-19 LAB — HEPATITIS C ANTIBODY
Hepatitis C Ab: NONREACTIVE
SIGNAL TO CUT-OFF: 0.13 (ref <1.00)

## 2022-04-19 LAB — THYROID PEROXIDASE ANTIBODY: Thyroperoxidase Ab SerPl-aCnc: 7 IU/mL (ref <9)

## 2022-05-17 ENCOUNTER — Inpatient Hospital Stay: Admission: RE | Admit: 2022-05-17 | Payer: Commercial Managed Care - PPO | Source: Ambulatory Visit

## 2022-05-30 ENCOUNTER — Encounter: Payer: Self-pay | Admitting: Family Medicine

## 2022-05-30 ENCOUNTER — Ambulatory Visit: Payer: Commercial Managed Care - PPO | Admitting: Family Medicine

## 2022-05-30 VITALS — BP 116/68 | HR 72 | Temp 97.5°F | Ht 60.25 in | Wt 121.2 lb

## 2022-05-30 DIAGNOSIS — R5382 Chronic fatigue, unspecified: Secondary | ICD-10-CM

## 2022-05-30 DIAGNOSIS — N3941 Urge incontinence: Secondary | ICD-10-CM

## 2022-05-30 DIAGNOSIS — R32 Unspecified urinary incontinence: Secondary | ICD-10-CM

## 2022-05-30 LAB — POC URINALSYSI DIPSTICK (AUTOMATED)
Bilirubin, UA: NEGATIVE
Blood, UA: NEGATIVE
Glucose, UA: NEGATIVE
Ketones, UA: NEGATIVE
Leukocytes, UA: NEGATIVE
Nitrite, UA: NEGATIVE
Protein, UA: NEGATIVE
Spec Grav, UA: 1.025 (ref 1.010–1.025)
Urobilinogen, UA: 0.2 E.U./dL
pH, UA: 6 (ref 5.0–8.0)

## 2022-05-30 MED ORDER — OXYBUTYNIN CHLORIDE 5 MG PO TABS: 5.0000 mg | ORAL_TABLET | Freq: Two times a day (BID) | ORAL | 3 refills | Status: AC | PRN

## 2022-05-30 MED ORDER — VITAMIN B-12 1000 MCG PO TABS: 1000.0000 ug | ORAL_TABLET | Freq: Every day | ORAL | Status: AC

## 2022-05-30 MED ORDER — VITAMIN D3 25 MCG (1000 UT) PO CAPS
1.0000 | ORAL_CAPSULE | Freq: Every day | ORAL | Status: AC
Start: 2022-05-30 — End: ?

## 2022-05-30 NOTE — Progress Notes (Unsigned)
Patient ID: Olivia Estrada, female    DOB: 1976/02/19, 46 y.o.   MRN: [id number]  This visit was conducted in person.  BP 116/68   Pulse 72   Temp (!) 97.5 F (36.4 C) (Temporal)   Ht 5' 0.25" (1.53 m)   Wt 121 lb 4 oz (55 kg)   LMP 05/25/2022   SpO2 98%   BMI 23.48 kg/m    CC: urinary symptoms Subjective:   HPI: Olivia Estrada is a 46 y.o. female presenting on 05/30/2022 for Urinary Incontinence (C/o not being able to hold urine and low back pain. Sxs started about 1 yr ago, worsening. )   1 yr h/o urinary urgency, incomplete emptying, and frequency. Some midline lower back pain in the mornings - attributed to known bulging discs.   No fever/chills, dysuria, hematuria, flank pain, suprapubic pressure, nausea/vomiting. No leg pain, no saddle anesthesia numbness or weakness of legs.   + foot on floor, key in door phenomenon. No stress incontinence symptoms.   Episode 3 wks ago of urinary urge incontinence.  A week later had urinary accident that started while asleep, unable to get to bathroom in time.   Good water intake.  Limits caffeine, dark sodas, spicy foods.  Predominantly drinks water.  12 oz coffee in the morning, then ~48 oz water.   Normal pregnancy x1, vaginal delivery.   Notes ongoing fatigue. She eats raw garlic daily.      Relevant past medical, surgical, family and social history reviewed and updated as indicated. Interim medical history since our last visit reviewed. Allergies and medications reviewed and updated. Outpatient Medications Prior to Visit  Medication Sig Dispense Refill   ibuprofen (ADVIL) 200 MG tablet Take 1 tablet (200 mg total) by mouth every 6 (six) hours as needed.     Iron, Ferrous Sulfate, 325 (65 Fe) MG TABS Take 325 mg by mouth every other day.     Magnesium 200 MG TABS Take 1 tablet (200 mg total) by mouth daily. 30 tablet    melatonin 5 MG TABS Take 1 tablet (5 mg total) by mouth at bedtime as needed.  0   MISC NATURAL  PRODUCTS PO Take by mouth daily. Turemeric     Multiple Vitamin (MULTIVITAMIN) tablet Take 1 tablet by mouth daily.     Omega-3 Fatty Acids (FISH OIL) 1200 MG CAPS Take 1 capsule (1,200 mg total) by mouth daily.     No facility-administered medications prior to visit.     Per HPI unless specifically indicated in ROS section below Review of Systems  Objective:  BP 116/68   Pulse 72   Temp (!) 97.5 F (36.4 C) (Temporal)   Ht 5' 0.25" (1.53 m)   Wt 121 lb 4 oz (55 kg)   LMP 05/25/2022   SpO2 98%   BMI 23.48 kg/m   Wt Readings from Last 3 Encounters:  05/30/22 121 lb 4 oz (55 kg)  04/17/22 123 lb 4 oz (55.9 kg)  03/22/21 124 lb 14.4 oz (56.7 kg)      Physical Exam Vitals and nursing note reviewed.  Constitutional:      Appearance: Normal appearance. She is not ill-appearing.  Abdominal:     General: Abdomen is flat. Bowel sounds are normal. There is no distension.     Palpations: Abdomen is soft. There is no mass.     Tenderness: There is no abdominal tenderness. There is no right CVA tenderness, left CVA tenderness, guarding or rebound.  Musculoskeletal:     Comments: No midline spine or paraspinous mm tenderness  Skin:    General: Skin is warm and dry.  Neurological:     Mental Status: She is alert.  Psychiatric:        Mood and Affect: Mood normal.        Behavior: Behavior normal.       Results for orders placed or performed in visit on 05/30/22  POCT Urinalysis Dipstick (Automated)  Result Value Ref Range   Color, UA yellow    Clarity, UA clear    Glucose, UA Negative Negative   Bilirubin, UA negative    Ketones, UA negative    Spec Grav, UA 1.025 1.010 - 1.025   Blood, UA negative    pH, UA 6.0 5.0 - 8.0   Protein, UA Negative Negative   Urobilinogen, UA 0.2 0.2 or 1.0 E.U./dL   Nitrite, UA negative    Leukocytes, UA Negative Negative    Assessment & Plan:   Problem List Items Addressed This Visit   None Visit Diagnoses     Urinary  incontinence, unspecified type    -  Primary   Relevant Orders   POCT Urinalysis Dipstick (Automated) (Completed)   Urine Culture        Meds ordered this encounter  Medications   vitamin B-12 (CYANOCOBALAMIN) 1000 MCG tablet    Sig: Take 1 tablet (1,000 mcg total) by mouth daily.   Cholecalciferol (VITAMIN D3) 25 MCG (1000 UT) CAPS    Sig: Take 1 capsule (1,000 Units total) by mouth daily.    Dispense:  30 capsule   Orders Placed This Encounter  Procedures   Urine Culture   POCT Urinalysis Dipstick (Automated)     Patient instructions: Orina normal hoy - mandaremos cultura.  Suena como incontinence de [place].  Trate medicina oxybutynin '5mg'$  dos veces al dia para sintomas.  Si no mejora, dejame saber para remision a urologo.   Follow up plan: No follow-ups on file.  Ria Bush, MD

## 2022-05-30 NOTE — Patient Instructions (Addendum)
Orina normal hoy - mandaremos cultura.  Suena como incontinence de [place].  Trate medicina oxybutynin '5mg'$  dos veces al dia para sintomas.  Si no mejora, dejame saber para remision a urologo.   Incontinencia urinaria Urinary Incontinence La incontinencia urinaria es una afeccin en la cual una persona no puede controlar dnde y Financial risk analyst. Ardelia Mems persona con esta afeccin orinar involuntariamente. Esto significa que la persona orina cuando no tiene intencin de [place]. Cules son las causas? Esta afeccin puede ser causada por lo siguiente: Medicamentos. Infecciones. Estreimiento. Msculos de la vejiga hiperactivos. Msculos de la vejiga dbiles. Msculos del suelo de la pelvis dbiles. Estos msculos proporcionan apoyo a la vejiga, el intestino y el tero, en el caso de las [place]. Aumento del tamao de la prstata en los hombres. La prstata es una glndula cerca de la vejiga. Cuando se [place], puede comprimir la [place]. Si la uretra est obstruida, la vejiga puede debilitarse y perder la capacidad para vaciarse correctamente. Ciruga. Factores emocionales, como la ansiedad, el estrs o el trastorno de estrs postraumtico (TEP). Lesin en la mdula espinal, lesin nerviosa u otras afecciones neurolgicas. Prolapso de los rganos de la pelvis. Esto ocurre en las mujeres, cuando los rganos se salen de Environmental consultant, hacia la vagina. Este movimiento puede evitar que la vejiga y la uretra funcionen correctamente. Qu incrementa el riesgo? Los siguientes factores pueden hacer que sea ms propenso a Armed forces training and education officer afeccin: Edad. Cuanto mayor es, mayor es Catering manager. Obesidad. Estar fsicamente inactivo. Embarazo y [place]. Menopausia. Enfermedades que [employer] nervios o la mdula espinal. Tos de larga duracin o crnica. Esto puede aumentar la presin en los msculos de la vejiga y del suelo plvico. Cules son los signos o sntomas? Los sntomas pueden variar, segn el tipo de  incontinencia urinaria que tenga. Entre ellos, se incluyen los siguientes: Una necesidad repentina de Garment/textile technologist, y orinar involuntariamente antes de llegar al bao (incontinencia imperiosa). Orinar repentinamente al realizar [employer] provocan la miccin, como toser, rer, Field seismologist ejercicio o estornudar (incontinencia por esfuerzo). Tener ganas de orinar con frecuencia, Catering manager en pequeas cantidades o gotear orina constantemente (incontinencia por rebosamiento). Orinar porque no puede llegar al bao a tiempo a causa de una discapacidad fsica, como la artritis o una lesin, o debido a afecciones comunicativas o cognitivas, como la enfermedad de Alzheimer (incontinencia funcional). Cmo se diagnostica? Esta afeccin se puede diagnosticar en funcin de lo siguiente: Sus antecedentes mdicos. Un examen fsico. Pruebas, como, por ejemplo: [place] de [place]. Radiografas del rin y de la vejiga. Ecografa. Exploracin por tomografa computarizada (TC). Cistoscopia. En este procedimiento, el mdico inserta un tubo con Ardelia Mems luz y Carlota Raspberry (cistoscopio) por la uretra hacia la vejiga para Education officer, environmental. Estudios urodinmicos. Estos estudios evalan la capacidad de la vejiga, de la uretra y del esfnter para Financial controller y Radio broadcast assistant orina. Hay distintos tipos de estudios urodinmicos, que pueden variar segn lo que Geophysical data processor. Para ayudar a diagnosticar la afeccin, el mdico puede recomendarle que mantenga un registro de la frecuencia con la que [place] y de la cantidad de [place]. Cmo se trata? El tratamiento de esta afeccin depende del tipo de incontinencia que tenga y de su causa. El tratamiento puede incluir: Cambios en el estilo de vida, como por ejemplo: Dejar de fumar. Mantener un peso saludable. Mantenerse activo. Trate de hacer 150 minutos de ejercicio de intensidad moderada cada semana. Pregntele al mdico qu actividades son seguras para usted. Seguir una dieta  saludable. Evite los alimentos  con alto contenido de grasa, como los alimentos fritos. Reduzca el consumo de carbohidratos refinados, como el pan blanco y el arroz blanco. Limite la cantidad de cafena que consume. Aumente el consumo de [place]. Las fuentes saludables de fibra incluyen los frijoles, los cereales integrales y las frutas y verduras frescas. Cambios en el comportamiento, por ejemplo: Ejercicios para el msculo del piso plvico. Ejercitacin de la vejiga, como prolongar la cantidad de [employer] las idas al bao o ir al bao en intervalos regulares. Aplicar tcnicas para reducir las urgencias de la vejiga. Esto puede incluir tcnicas de distraccin o ejercicios de respiracin controlada. Medicamentos, como por ejemplo: Medicamentos para Scientist, research (life sciences) de la vejiga y Product/process development scientist los espasmos de la vejiga. Medicamentos para disminuir o Geographical information systems officer de la prstata, en el caso de los hombres. Inyecciones de btox. Esto puede ayudar a [employer] de la vejiga. Tratamientos como los siguientes: Usar pulsos elctricos para Art therapist los reflejos de la vejiga (estimulacin nerviosa elctrica). En el caso de las [place], [place] un dispositivo mdico para evitar prdidas de [place]. Este es un dispositivo pequeo y Insurance risk surveyor, parecido a un tampn, que se inserta en la uretra. Inyectar colgeno o perlas de carbono (espesantes) en el esfnter urinario. Esto puede ayudar a espesar el tejido y cerrar la abertura de la vejiga. Ciruga. Siga estas instrucciones en su casa: Estilo de vida Limite el consumo de alcohol y cafena. Estos pueden llenar la vejiga rpidamente e irritarla. Mantenga su higiene personal para evitar malos olores y lesiones cutneas. Pregntele al mdico acerca de cremas y productos para la piel que puedan proteger la piel de la [place]. Considere usar compresas o paales para adultos. Asegrese de cambiarlos con regularidad y siempre cmbielos tras un episodio  de incontinencia. Instrucciones generales Use los medicamentos de venta libre y los recetados solamente como se lo haya indicado el mdico. Trate de ir al bao cada 3 o 4 horas, aunque no sienta la necesidad de Garment/textile technologist. Tmese el tiempo para vaciar la vejiga completamente cada vez que orine. Despus de orinar, espere un minuto. Luego trate de orinar nuevamente. Adopte una posicin cmoda para orinar. Si la causa de su incontinencia son problemas nerviosos, lleve un registro de los medicamentos que toma y de las veces que vaya al bao. Cumpla con todas las visitas de seguimiento. Esto es importante. Dnde buscar ms informacin [employer] of Diabetes and Digestive and Kidney Diseases ([place] Diabetes y [place] y [place]): [email or web address].dk American Urology Association (Asociacin Estadounidense de Jeni Salles): www.urologyhealth.org Comunquese con un mdico si: Su dolor empeora. La incontinencia empeora. Solicite ayuda de inmediato si: Tiene fiebre o escalofros. No puede orinar. Tiene enrojecimiento en la zona de la ingle o en las piernas. Resumen La incontinencia urinaria es una afeccin en la cual una persona no puede controlar dnde y Financial risk analyst. Esta afeccin puede ser causada por medicamentos, infecciones, debilidad en los msculos de la vejiga, debilidad en los msculos del suelo plvico, agrandamiento de la prstata (en los hombres) o [place]. Factores como la vejez, la obesidad, Water quality scientist y [place], la menopausia, las enfermedades neurolgicas y la tos crnica pueden aumentar el riesgo de tener esta afeccin. Los tipos de incontinencia urinaria son la incontinencia imperiosa, la incontinencia por esfuerzo, la incontinencia por rebosamiento y la incontinencia funcional. Por lo general, esta afeccin se trata primero con cambios en el estilo de vida y el comportamiento, como dejar de fumar, adoptar una alimentacin saludable y  hacer  ejercicios para el suelo plvico con regularidad. Otras opciones de tratamiento incluyen medicamentos, espesantes, dispositivos mdicos, neuroestimulacin elctrica o ciruga. Esta informacin no tiene Marine scientist el consejo del mdico. Asegrese de hacerle al mdico cualquier pregunta que tenga. Document Revised: 05/08/2021 Document Reviewed: 05/08/2021 Elsevier Patient Education  [place].

## 2022-05-31 DIAGNOSIS — N3941 Urge incontinence: Secondary | ICD-10-CM | POA: Insufficient documentation

## 2022-05-31 LAB — URINE CULTURE
MICRO NUMBER:: 13583662
Result:: NO GROWTH
SPECIMEN QUALITY:: ADEQUATE

## 2022-05-31 NOTE — Assessment & Plan Note (Addendum)
Ongoing fatigue. Recent TSH, ESR, blood counts normal. She is regularly taking vitamin supplements as well.

## 2022-05-31 NOTE — Assessment & Plan Note (Addendum)
Describes overactive bladder with urinary urge incontinence. UA normal, send UCx given worsening symptoms.  Discussed differences between stress and urge incontinence as well as treatment options.  Will start antimuscarinic oxybutynin '5mg'$  BID, watching for possible anticholinergic side effects including dry mouth, constipation.  Update with effect to consider medication titration vs XR forms. If no improvement, discussed urology referral.

## 2023-05-17 ENCOUNTER — Other Ambulatory Visit: Payer: Self-pay | Admitting: Family Medicine

## 2023-05-17 DIAGNOSIS — Z1231 Encounter for screening mammogram for malignant neoplasm of breast: Secondary | ICD-10-CM

## 2023-06-13 ENCOUNTER — Ambulatory Visit
Admission: RE | Admit: 2023-06-13 | Discharge: 2023-06-13 | Disposition: A | Discharge status: Home / Self Care | Payer: Commercial Managed Care - PPO | Source: Ambulatory Visit | Attending: Family Medicine | Admitting: Family Medicine

## 2023-06-13 DIAGNOSIS — Z1231 Encounter for screening mammogram for malignant neoplasm of breast: Secondary | ICD-10-CM | POA: Diagnosis not present

## 2024-05-25 ENCOUNTER — Other Ambulatory Visit: Payer: Self-pay | Admitting: Family Medicine

## 2024-05-25 DIAGNOSIS — Z1231 Encounter for screening mammogram for malignant neoplasm of breast: Secondary | ICD-10-CM

## 2024-06-17 ENCOUNTER — Encounter

## 2024-09-24 ENCOUNTER — Ambulatory Visit
Admission: RE | Admit: 2024-09-24 | Discharge: 2024-09-24 | Disposition: A | Discharge status: Home / Self Care | Source: Ambulatory Visit | Attending: Family Medicine | Admitting: Family Medicine

## 2024-09-24 DIAGNOSIS — Z1231 Encounter for screening mammogram for malignant neoplasm of breast: Secondary | ICD-10-CM | POA: Insufficient documentation
